# Patient Record
Sex: Male | Born: 1965 | Race: Black or African American | Hispanic: No | Marital: Single | Smoking: Never smoker
Health system: Southern US, Community
[De-identification: ages and names within clinical notes are randomized; demographics above are authoritative.]

## PROBLEM LIST (undated history)

## (undated) DIAGNOSIS — E119 Type 2 diabetes mellitus without complications: Secondary | ICD-10-CM

## (undated) DIAGNOSIS — I10 Essential (primary) hypertension: Principal | ICD-10-CM

## (undated) DIAGNOSIS — M1990 Unspecified osteoarthritis, unspecified site: Secondary | ICD-10-CM

## (undated) DIAGNOSIS — E669 Obesity, unspecified: Secondary | ICD-10-CM

## (undated) DIAGNOSIS — G5600 Carpal tunnel syndrome, unspecified upper limb: Secondary | ICD-10-CM

## (undated) DIAGNOSIS — E7800 Pure hypercholesterolemia, unspecified: Secondary | ICD-10-CM

## (undated) HISTORY — PX: KNEE ARTHROSCOPY: SHX127

## (undated) HISTORY — DX: Obesity, unspecified: E66.9

## (undated) HISTORY — PX: COLONOSCOPY: SHX174

## (undated) HISTORY — DX: Unspecified osteoarthritis, unspecified site: M19.90

## (undated) HISTORY — DX: Essential (primary) hypertension: I10

---

## 2013-09-28 ENCOUNTER — Encounter (HOSPITAL_BASED_OUTPATIENT_CLINIC_OR_DEPARTMENT_OTHER): Payer: Self-pay | Admitting: Emergency Medicine

## 2013-09-28 ENCOUNTER — Emergency Department (HOSPITAL_BASED_OUTPATIENT_CLINIC_OR_DEPARTMENT_OTHER): Payer: PRIVATE HEALTH INSURANCE

## 2013-09-28 ENCOUNTER — Emergency Department (HOSPITAL_BASED_OUTPATIENT_CLINIC_OR_DEPARTMENT_OTHER)
Admission: EM | Admit: 2013-09-28 | Discharge: 2013-09-29 | Disposition: A | Discharge status: Home / Self Care | Payer: PRIVATE HEALTH INSURANCE | Attending: Emergency Medicine | Admitting: Emergency Medicine

## 2013-09-28 DIAGNOSIS — R296 Repeated falls: Secondary | ICD-10-CM | POA: Diagnosis not present

## 2013-09-28 DIAGNOSIS — S59909A Unspecified injury of unspecified elbow, initial encounter: Secondary | ICD-10-CM | POA: Diagnosis present

## 2013-09-28 DIAGNOSIS — Y929 Unspecified place or not applicable: Secondary | ICD-10-CM | POA: Insufficient documentation

## 2013-09-28 DIAGNOSIS — M7022 Olecranon bursitis, left elbow: Secondary | ICD-10-CM

## 2013-09-28 DIAGNOSIS — Y939 Activity, unspecified: Secondary | ICD-10-CM | POA: Insufficient documentation

## 2013-09-28 DIAGNOSIS — S59919A Unspecified injury of unspecified forearm, initial encounter: Secondary | ICD-10-CM | POA: Diagnosis present

## 2013-09-28 DIAGNOSIS — S6990XA Unspecified injury of unspecified wrist, hand and finger(s), initial encounter: Secondary | ICD-10-CM

## 2013-09-28 DIAGNOSIS — M7020 Olecranon bursitis, unspecified elbow: Secondary | ICD-10-CM | POA: Insufficient documentation

## 2013-09-28 NOTE — ED Provider Notes (Signed)
CSN: [id number]     Arrival date & time 09/28/13  2024 History  This chart was scribed for Julianne Rice, MD by Jeanell Sparrow, ED Scribe. This patient was seen in room MH05/MH05 and the patient's care was started at 11:30 PM.   Chief Complaint  Patient presents with  . Arm Injury    Patient is a 48 y.o. male presenting with arm injury. The history is provided by the patient. No language interpreter was used.  Arm Injury Associated symptoms: no fever    HPI Comments: Alex Gray is a 48 y.o. male who presents to the Emergency Department complaining of a left elbow injury that occurred about 2 weeks ago. He states that he fell on his elbow. He reports that he recently had the affected area drained. He states that the drainage fluid was a clear. He reports no other symptoms. He's had recurrence of swelling to the elbow. He denies any fevers or chills. Denies any warmth or bony tenderness.  History reviewed. No pertinent past medical history. Past Surgical History  Procedure Laterality Date  . Knee arthroscopy     History reviewed. No pertinent family history. History  Substance Use Topics  . Smoking status: Never Smoker   . Smokeless tobacco: Not on file  . Alcohol Use: No    Review of Systems  Constitutional: Negative for fever and chills.  Musculoskeletal: Positive for joint swelling. Negative for arthralgias.  Skin: Negative for rash and wound.  Neurological: Negative for weakness and numbness.  All other systems reviewed and are negative.   Allergies  Review of patient's allergies indicates no known allergies.  Home Medications   Prior to Admission medications   Not on File   BP 153/88  Pulse 84  Temp(Src) 97.9 F (36.6 C) (Oral)  Resp 18  Ht 6\' 3"  (1.905 m)  Wt 260 lb (117.935 kg)  BMI 32.50 kg/m2  SpO2 98% Physical Exam  Nursing note and vitals reviewed. Constitutional: He is oriented to person, place, and time. He appears well-developed and well-nourished.  No distress.  HENT:  Head: Normocephalic and atraumatic.  Mouth/Throat: Oropharynx is clear and moist.  Eyes: Pupils are equal, round, and reactive to light.  Neck: Normal range of motion. Neck supple.  Cardiovascular: Normal rate.   Pulmonary/Chest: Effort normal.  Abdominal: Soft. Bowel sounds are normal.  Musculoskeletal: Normal range of motion. He exhibits tenderness. He exhibits no edema.  Visual full range of motion of the left elbow. Noted swelling over the olecranon process. His fluctuance. No overlying erythema or warmth. Patient has no bony tenderness. Distal pulses are 2+.  Neurological: He is alert and oriented to person, place, and time.  5/5 motor in all extremities. Sensation is intact.  Skin: Skin is warm and dry. No rash noted. No erythema.  Psychiatric: He has a normal mood and affect. His behavior is normal.    ED Course  Procedures (including critical care time) DIAGNOSTIC STUDIES: Oxygen Saturation is 98% on RA, normal by my interpretation.    COORDINATION OF CARE: 11:33 PM- Pt advised of plan for treatment and pt agrees.  Labs Review Labs Reviewed - No data to display  Imaging Review Dg Elbow Complete Left  09/28/2013   CLINICAL DATA:  Injury to the back of the elbow 1 1/2 weeks ago complaining of pain and swelling since that time.  EXAM: LEFT ELBOW - COMPLETE 3+ VIEW  COMPARISON:  No priors.  FINDINGS: Large amount of soft tissue swelling overlying the olecranon  process. Large enthesophyte off the dorsal aspect of the olecranon process. No acute displaced fracture, subluxation or dislocation.  IMPRESSION: 1. Large amount of soft tissue swelling overlying the olecranon process. 2. Negative for acute fracture.   Electronically Signed   By: Vinnie Langton M.D.   On: 09/28/2013 21:17     EKG Interpretation None      MDM   Final diagnoses:  None    I personally performed the services described in this documentation, which was scribed in my presence. The  recorded information has been reviewed and is accurate.  Exam consistent with inflammatory bursitis. Doubt infection. Patient asked to have it drained in the emergency department. Prior to completion procedure, patiet states he has to leave. He is advised to followup with hand surgery for persistent symptoms. Return precautions given.    Julianne Rice, MD 09/29/13 [phone]

## 2013-09-28 NOTE — ED Notes (Signed)
Pt c/o left elbow injury x 2 weeks ago

## 2013-09-29 MED ORDER — TRAMADOL HCL 50 MG PO TABS: 50.0000 mg | ORAL_TABLET | Freq: Four times a day (QID) | ORAL | Status: DC | PRN

## 2013-09-29 MED ORDER — IBUPROFEN 600 MG PO TABS: 600.0000 mg | ORAL_TABLET | Freq: Four times a day (QID) | ORAL | Status: DC | PRN

## 2013-09-29 NOTE — Discharge Instructions (Signed)
Bursitis °Bursitis is a swelling and soreness (inflammation) of a fluid-filled sac (bursa) that overlies and protects a joint. It can be caused by injury, overuse of the joint, arthritis or infection. The joints most likely to be affected are the elbows, shoulders, hips and knees. °HOME CARE INSTRUCTIONS  °· Apply ice to the affected area for 15-20 minutes each hour while awake for 2 days. Put the ice in a plastic bag and place a towel between the bag of ice and your skin. °· Rest the injured joint as much as possible, but continue to put the joint through a full range of motion, 4 times per day. (The shoulder joint especially becomes rapidly "frozen" if not used.) When the pain lessens, begin normal slow movements and usual activities. °· Only take over-the-counter or prescription medicines for pain, discomfort or fever as directed by your caregiver. °· Your caregiver may recommend draining the bursa and injecting medicine into the bursa. This may help the healing process. °· Follow all instructions for follow-up with your caregiver. This includes any orthopedic referrals, physical therapy and rehabilitation. Any delay in obtaining necessary care could result in a delay or failure of the bursitis to heal and chronic pain. °SEEK IMMEDIATE MEDICAL CARE IF:  °· Your pain increases even during treatment. °· You develop an oral temperature above 102° F (38.9° C) and have heat and inflammation over the involved bursa. °MAKE SURE YOU:  °· Understand these instructions. °· Will watch your condition. °· Will get help right away if you are not doing well or get worse. °Document Released: 02/03/2000 Document Revised: 04/30/2011 Document Reviewed: 04/27/2013 °Exit[employer]® Patient Information ©2015 [employer]. This information is not intended to replace advice given to you by your health care provider. Make sure you discuss any questions you have with your health care provider. ° °

## 2014-05-24 ENCOUNTER — Emergency Department (HOSPITAL_BASED_OUTPATIENT_CLINIC_OR_DEPARTMENT_OTHER)
Admission: EM | Admit: 2014-05-24 | Discharge: 2014-05-24 | Disposition: A | Discharge status: Home / Self Care | Payer: 59 | Attending: Emergency Medicine | Admitting: Emergency Medicine

## 2014-05-24 ENCOUNTER — Emergency Department (HOSPITAL_BASED_OUTPATIENT_CLINIC_OR_DEPARTMENT_OTHER): Payer: 59

## 2014-05-24 ENCOUNTER — Encounter (HOSPITAL_BASED_OUTPATIENT_CLINIC_OR_DEPARTMENT_OTHER): Payer: Self-pay

## 2014-05-24 DIAGNOSIS — Z8669 Personal history of other diseases of the nervous system and sense organs: Secondary | ICD-10-CM | POA: Insufficient documentation

## 2014-05-24 DIAGNOSIS — M25531 Pain in right wrist: Secondary | ICD-10-CM | POA: Diagnosis not present

## 2014-05-24 DIAGNOSIS — M25539 Pain in unspecified wrist: Secondary | ICD-10-CM

## 2014-05-24 HISTORY — DX: Carpal tunnel syndrome, unspecified upper limb: G56.00

## 2014-05-24 MED ORDER — HYDROCODONE-ACETAMINOPHEN 5-325 MG PO TABS: 1.0000 | ORAL_TABLET | Freq: Four times a day (QID) | ORAL | Status: DC | PRN

## 2014-05-24 MED ORDER — PREDNISONE 50 MG PO TABS: 50.0000 mg | ORAL_TABLET | Freq: Every day | ORAL | Status: DC

## 2014-05-24 MED ORDER — KETOROLAC TROMETHAMINE 60 MG/2ML IM SOLN
60.0000 mg | Freq: Once | INTRAMUSCULAR | Status: AC
  Administered 2014-05-24: 60 mg via INTRAMUSCULAR
  Filled 2014-05-24: qty 2

## 2014-05-24 NOTE — ED Notes (Signed)
Patient transported to X-ray 

## 2014-05-24 NOTE — Discharge Instructions (Signed)
Return here as needed.  Follow-up with the orthopedist provided.  Ice and elevate the wrist

## 2014-05-24 NOTE — ED Notes (Signed)
right wrist pain x 3 days-denies injury-hx of carpal tunnel

## 2014-05-24 NOTE — ED Provider Notes (Signed)
CSN: [id number]     Arrival date & time 05/24/14  1427 History   First MD Initiated Contact with Patient 05/24/14 1611     Chief Complaint  Patient presents with  . Wrist Pain     (Consider location/radiation/quality/duration/timing/severity/associated sxs/prior Treatment) HPI Patient presents to the emergency department with right wrist pain it has been ongoing for the last 3 days.  The patient states that he has had issues with his wrists in the past.  Patient states that he does a lot of typing and he feels that that causes problems in his wrist.  The patient states that he bought a brace to wear for the pain, which did not seem to help significantly.  The patient took some ibuprofen with some relief of his symptoms.  Patient denies fevers, weakness, numbness, arm swelling, shortness of breath or neck pain.  The patient states that movement and palpation makes the pain worse Past Medical History  Diagnosis Date  . Carpal tunnel syndrome    Past Surgical History  Procedure Laterality Date  . Knee arthroscopy     No family history on file. History  Substance Use Topics  . Smoking status: Never Smoker   . Smokeless tobacco: Not on file  . Alcohol Use: No    Review of Systems  All other systems negative except as documented in the HPI. All pertinent positives and negatives as reviewed in the HPI.  Allergies  Review of patient's allergies indicates no known allergies.  Home Medications   Prior to Admission medications   Not on File   BP 147/94 mmHg  Pulse 92  Temp(Src) 98.3 F (36.8 C) (Oral)  Resp 18  Ht 6\' 3"  (1.905 m)  Wt 260 lb (117.935 kg)  BMI 32.50 kg/m2  SpO2 96% Physical Exam  Constitutional: He is oriented to person, place, and time. He appears well-developed and well-nourished.  HENT:  Head: Normocephalic and atraumatic.  Pulmonary/Chest: Effort normal.  Musculoskeletal:       Right wrist: He exhibits decreased range of motion, tenderness and swelling.  He exhibits no bony tenderness, no crepitus, no deformity and no laceration.  Neurological: He is alert and oriented to person, place, and time. He exhibits normal muscle tone. Coordination normal.  Skin: Skin is warm and dry. No rash noted.  Nursing note and vitals reviewed.   ED Course  Procedures (including critical care time) Labs Review Labs Reviewed - No data to display  Imaging Review Dg Wrist 2 Views Right  05/24/2014   CLINICAL DATA:  Patient with right wrist pain for multiple days. No definite traumatic injury.  EXAM: RIGHT WRIST - 2 VIEW  COMPARISON:  None.  FINDINGS: AP and lateral views of the wrist demonstrate no evidence for acute fracture. Joint space narrowing at the radiocarpal joint with associated subchondral sclerosis involving the distal aspect of the radius. The soft tissue abnormality.  IMPRESSION: No acute osseous abnormality.  Radiocarpal joint degenerative change.   Electronically Signed   By: Lovey Newcomer M.D.   On: 05/24/2014 16:33   Patient be referred to orthopedics for further evaluation.  Told to return here as needed.  Ice and elevate the wrist.  I explained to the patient that this could be carpal tunnel versus acute gout flare.    Dalia Heading, PA-C 05/24/14 1726  Malvin Johns, MD 05/25/14 [phone]

## 2015-07-13 DIAGNOSIS — S99911A Unspecified injury of right ankle, initial encounter: Secondary | ICD-10-CM | POA: Diagnosis not present

## 2015-07-13 DIAGNOSIS — M19071 Primary osteoarthritis, right ankle and foot: Secondary | ICD-10-CM | POA: Diagnosis not present

## 2015-07-13 DIAGNOSIS — M25571 Pain in right ankle and joints of right foot: Secondary | ICD-10-CM | POA: Diagnosis not present

## 2015-07-26 DIAGNOSIS — M109 Gout, unspecified: Secondary | ICD-10-CM | POA: Diagnosis not present

## 2015-07-26 DIAGNOSIS — M79672 Pain in left foot: Secondary | ICD-10-CM | POA: Diagnosis not present

## 2015-11-24 ENCOUNTER — Encounter: Payer: Self-pay | Admitting: *Deleted

## 2015-11-24 ENCOUNTER — Ambulatory Visit
Admission: EM | Admit: 2015-11-24 | Discharge: 2015-11-24 | Payer: BLUE CROSS/BLUE SHIELD | Attending: Family Medicine | Admitting: Family Medicine

## 2015-11-24 ENCOUNTER — Encounter (HOSPITAL_BASED_OUTPATIENT_CLINIC_OR_DEPARTMENT_OTHER): Payer: Self-pay | Admitting: *Deleted

## 2015-11-24 ENCOUNTER — Emergency Department (HOSPITAL_BASED_OUTPATIENT_CLINIC_OR_DEPARTMENT_OTHER)
Admission: EM | Admit: 2015-11-24 | Discharge: 2015-11-24 | Disposition: A | Discharge status: Home / Self Care | Payer: BLUE CROSS/BLUE SHIELD | Attending: Emergency Medicine | Admitting: Emergency Medicine

## 2015-11-24 DIAGNOSIS — G5600 Carpal tunnel syndrome, unspecified upper limb: Secondary | ICD-10-CM | POA: Insufficient documentation

## 2015-11-24 DIAGNOSIS — R030 Elevated blood-pressure reading, without diagnosis of hypertension: Secondary | ICD-10-CM | POA: Diagnosis not present

## 2015-11-24 DIAGNOSIS — Z79899 Other long term (current) drug therapy: Secondary | ICD-10-CM | POA: Diagnosis not present

## 2015-11-24 DIAGNOSIS — R42 Dizziness and giddiness: Secondary | ICD-10-CM | POA: Diagnosis present

## 2015-11-24 DIAGNOSIS — I10 Essential (primary) hypertension: Secondary | ICD-10-CM | POA: Diagnosis not present

## 2015-11-24 DIAGNOSIS — R55 Syncope and collapse: Secondary | ICD-10-CM | POA: Insufficient documentation

## 2015-11-24 DIAGNOSIS — Z9889 Other specified postprocedural states: Secondary | ICD-10-CM | POA: Diagnosis not present

## 2015-11-24 NOTE — ED Triage Notes (Signed)
Pt felt "light headed" at work today and Scientist, clinical (histocompatibility and immunogenetics) checked BP and it was high. Pt states diaphoretic at onset. Asymptomatic at present.

## 2015-11-24 NOTE — ED Provider Notes (Signed)
[place] [facility] Provider Note   CSN: [id number] Arrival date & time: 11/24/15  2017  By signing my name below, I, Emmanuella Mensah, attest that this documentation has been prepared under the direction and in the presence of Deno Etienne, DO. Electronically Signed: Judithann Sauger, ED Scribe. 11/24/15. 9:05 PM.   History   Chief Complaint Chief Complaint  Patient presents with  . Dizziness    HPI Comments: Alex Gray is a 50 y.o. male who presents to the Emergency Department for evaluation s/p one episode of lightheadedness with increase in BP and diaphoresis while at work at 2 pm today. He explains that he was walking out of [employer] on break during onset of his symptoms with a sensation of fatigue where he felt "drained" but his symptoms progressed when he returned to work. He states that his symptoms resolved after he laid down for a few minutes but called EMS for evaluation. He adds that although EMS told him that he had a normal EKG, he wanted to follow up with an UC. At the [facility], he was advised to be evaluated in an ED after another normal EKG there. No alleviating factors noted. Pt has not tried any medications PTA. He is currently asymptomatic. He denies any current falls, injuries, or trauma but explains that lately he has not been having adequate sleep due to ongoing family issues. He also denies any recent travel outside the country or hx of cancers. Pt has NKDA. He denies any chest pain, shortness of breath, palpitations, nausea, vomiting, headache, dizziness, or weakness.   No PCP.   The history is provided by the patient. No language interpreter was used.     Past Medical History:  Diagnosis Date  . Carpal tunnel syndrome     There are no active problems to display for this patient.   Past Surgical History:  Procedure Laterality Date  . KNEE ARTHROSCOPY         Home Medications    Prior to Admission medications   Medication Sig Start Date End Date  Taking? Authorizing Provider  HYDROcodone-acetaminophen (NORCO/VICODIN) 5-325 MG per tablet Take 1 tablet by mouth every 6 (six) hours as needed for moderate pain. 05/24/14   Dalia Heading, PA-C  naproxen sodium (ANAPROX) 220 MG tablet Take 220 mg by mouth 2 (two) times daily with a meal.    Historical Provider, MD  predniSONE (DELTASONE) 50 MG tablet Take 1 tablet (50 mg total) by mouth daily. 05/24/14   Dalia Heading, PA-C    Family History No family history on file.  Social History Social History  Substance Use Topics  . Smoking status: Never Smoker  . Smokeless tobacco: Never Used  . Alcohol use No     Allergies   Review of patient's allergies indicates no known allergies.   Review of Systems Review of Systems  Constitutional: Negative for chills and fever.  HENT: Negative for congestion and facial swelling.   Eyes: Negative for discharge and visual disturbance.  Gastrointestinal: Negative for abdominal pain.  Musculoskeletal: Negative for arthralgias and myalgias.  Skin: Negative for color change and rash.  Neurological: Negative for tremors and syncope.  Psychiatric/Behavioral: Negative for confusion and dysphoric mood.     Physical Exam Updated Vital Signs BP 159/91   Pulse 67   Temp 98.5 F (36.9 C) (Oral)   Resp 20   Ht 6\' 3"  (1.905 m)   Wt 270 lb (122.5 kg)   SpO2 97%   BMI 33.75 kg/m  Physical Exam  Constitutional: He is oriented to person, place, and time. He appears well-developed and well-nourished.  HENT:  Head: Normocephalic and atraumatic.  Eyes: EOM are normal. Pupils are equal, round, and reactive to light.  Neck: Normal range of motion. Neck supple. No JVD present.  Cardiovascular: Normal rate and regular rhythm.  Exam reveals no gallop and no friction rub.   No murmur heard. Pulmonary/Chest: No respiratory distress. He has no wheezes.  Abdominal: He exhibits no distension. There is no rebound and no guarding.  Musculoskeletal:  Normal range of motion.  Neurological: He is alert and oriented to person, place, and time.  Skin: No rash noted. No pallor.  Psychiatric: He has a normal mood and affect. His behavior is normal.  Nursing note and vitals reviewed.    ED Treatments / Results  DIAGNOSTIC STUDIES: Oxygen Saturation is 97% on RA, normal by my interpretation.    COORDINATION OF CARE: 9:04 PM- Pt advised of plan for treatment and pt agrees.   Labs (all labs ordered are listed, but only abnormal results are displayed) Labs Reviewed - No data to display  EKG  EKG Interpretation None       Radiology No results found.  Procedures Procedures (including critical care time)  Medications Ordered in ED Medications - No data to display   Initial Impression / Assessment and Plan / ED Course  Deno Etienne, DO has reviewed the triage vital signs and the nursing notes.  Pertinent labs & imaging results that were available during my care of the patient were reviewed by me and considered in my medical decision making (see chart for details).  Clinical Course    50 yo M The chief complaints of a near syncopal episode. This happened earlier today. Since then the patient has had no complaints. He denies anything leading up to this. Denies any vomiting diarrhea denies any blood in his stool or dark stools. Patient denies fevers or chills denies cough or congestion. Patient was seen by EMS as well as an urgent care. He said the EKGs there were fine. He declines having a repeat study done here. I discussed that the patient can have this followed up outpatient name able to put on a Holter monitor if he has recurrent symptoms. Discharge home.  Final Clinical Impressions(s) / ED Diagnoses   Final diagnoses:  Near syncope    New Prescriptions Discharge Medication List as of 11/24/2015  9:06 PM     I personally performed the services described in this documentation, which was scribed in my presence. The recorded  information has been reviewed and is accurate.      Deno Etienne, DO 11/24/15 2221

## 2015-11-24 NOTE — ED Provider Notes (Signed)
[facility]    CSN: [id number] Arrival date & time: 11/24/15  1433     History   Chief Complaint Chief Complaint  Patient presents with  . Hypertension  . Near Syncope    HPI Alex Gray is a 50 y.o. male.   Patient is a large African-American male who comes in today with episode at work being lightheaded and diaphoretic. Apparently at work after lunch he became lightheaded and sweaty when the plant nurse EMS evaluated him his blood pressure is markedly elevated. EMS want to take him to the emergency room she declined he states but the left he felt better as blood pressures start to come down he does not have a history hypertension he denies having any chest pain during the spell but the spell lasted about 20 minutes he states that the EMS told his EKG was fine. Despite that he still came here to be reassured that everything was okay  There is a history of carpal tunnel syndrome knee surgery and is a question of gout with this patient. He does not smoke. States is a history of hypertension diabetes in the family. He reports that he's not been able to sleep lately and thinks the problem with sleep deprivation.   The history is provided by the patient. No language interpreter was used.  Hypertension  This is a new problem. The current episode started 1 to 2 hours ago. The problem has been gradually improving. Pertinent negatives include no chest pain, no abdominal pain, no headaches and no shortness of breath. Nothing aggravates the symptoms. Nothing relieves the symptoms. He has tried nothing for the symptoms. The treatment provided no relief.  Near Syncope  Pertinent negatives include no chest pain, no abdominal pain, no headaches and no shortness of breath.    Past Medical History:  Diagnosis Date  . Carpal tunnel syndrome     There are no active problems to display for this patient.   Past Surgical History:  Procedure Laterality Date  . KNEE ARTHROSCOPY          Home Medications    Prior to Admission medications   Medication Sig Start Date End Date Taking? Authorizing Provider  naproxen sodium (ANAPROX) 220 MG tablet Take 220 mg by mouth 2 (two) times daily with a meal.   Yes Historical Provider, MD  HYDROcodone-acetaminophen (NORCO/VICODIN) 5-325 MG per tablet Take 1 tablet by mouth every 6 (six) hours as needed for moderate pain. 05/24/14   Dalia Heading, PA-C  predniSONE (DELTASONE) 50 MG tablet Take 1 tablet (50 mg total) by mouth daily. 05/24/14   Dalia Heading, PA-C    Family History History reviewed. No pertinent family history.  Social History Social History  Substance Use Topics  . Smoking status: Never Smoker  . Smokeless tobacco: Never Used  . Alcohol use No     Allergies   Review of patient's allergies indicates no known allergies.   Review of Systems Review of Systems  Constitutional: Positive for diaphoresis.  Respiratory: Negative for shortness of breath.   Cardiovascular: Positive for near-syncope. Negative for chest pain.  Gastrointestinal: Negative for abdominal pain.  Neurological: Positive for weakness and light-headedness. Negative for headaches.  All other systems reviewed and are negative.    Physical Exam Triage Vital Signs ED Triage Vitals  Enc Vitals Group     BP 11/24/15 1459 (!) 160/99     Pulse Rate 11/24/15 1459 80     Resp 11/24/15 1459 16  Temp 11/24/15 1459 98.1 F (36.7 C)     Temp Source 11/24/15 1459 Oral     SpO2 11/24/15 1459 98 %     Weight 11/24/15 1501 270 lb (122.5 kg)     Height 11/24/15 1501 6\' 3"  (1.905 m)     Head Circumference --      Peak Flow --      Pain Score --      Pain Loc --      Pain Edu? --      Excl. in [place]? --    No data found.   Updated Vital Signs BP (!) 160/99 (BP Location: Left Arm)   Pulse 80   Temp 98.1 F (36.7 C) (Oral)   Resp 16   Ht 6\' 3"  (1.905 m)   Wt 270 lb (122.5 kg)   SpO2 98%   BMI 33.75 kg/m   Visual  Acuity Right Eye Distance:   Left Eye Distance:   Bilateral Distance:    Right Eye Near:   Left Eye Near:    Bilateral Near:     Physical Exam  Constitutional: He is oriented to person, place, and time. He appears well-developed and well-nourished.  Non-toxic appearance. He does not have a sickly appearance.  Obese male  HENT:  Head: Normocephalic and atraumatic.  Eyes: EOM are normal. Pupils are equal, round, and reactive to light.  Neck: Normal range of motion. Neck supple.  Cardiovascular: Normal rate and regular rhythm.   Pulmonary/Chest: Effort normal.  Musculoskeletal: Normal range of motion.  Neurological: He is alert and oriented to person, place, and time.  Skin: Skin is warm.  Psychiatric: He has a normal mood and affect.  Vitals reviewed.    UC Treatments / Results  Labs (all labs ordered are listed, but only abnormal results are displayed) Labs Reviewed - No data to display  EKG  EKG Interpretation None       ED ECG REPORT I, Wateen Varon H, the attending physician, personally viewed and interpreted this ECG.   Date: 11/24/2015  EKG Time: 15:15:53 Rate: 76 Rhythm: there are no previous tracings available for comparison, sinus rhythm with some PACs present  Axis: 33  Intervals:none  ST&T Change: Q wave in 3 and aVF possible old inferior infarct unable to say for sure  Radiology No results found.  Procedures Procedures (including critical care time)  Medications Ordered in UC Medications - No data to display   Initial Impression / Assessment and Plan / UC Course  I have reviewed the triage vital signs and the nursing notes.  Pertinent labs & imaging results that were available during my care of the patient were reviewed by me and considered in my medical decision making (see chart for details).  Clinical Course    Patient is pretty sure that his problems been occurring because his lack of sleep because his stress and tension the family  explained to him that usually lack of sleep doesn't cause a near syncopal episode. I have explained to him that this is an urgent care and that the EMS was going to transport him to [facility] ED for further evaluation. I recommend serial cardiac enzymes and monitoring and possible CT of the head for this near syncopal episode. He feels that since his blood pressure has come down not normal still everything is okay. I reiterated that he should be in the ER to be evaluated with serial cardiac enzymes minimal I've also explained to him I cannot give  him reassurance of what happened to him in an urgent care. He requests a note for work for today and states he will go to [facility]. Note was written for today and tomorrow. He's refused EMS and wants to drive there since he lives in [place].  Final Clinical Impressions(s) / UC Diagnoses   Final diagnoses:  None    New Prescriptions New Prescriptions   No medications on file     Frederich Cha, MD 11/24/15 1554

## 2015-11-24 NOTE — ED Triage Notes (Signed)
States at lunch he had an episode of dizziness and HTN. EMS was called to the scene and his BS and EKG were normal. His symptoms did not return but he went to UC right after that and had the same negative findings. He remains asymptomatic tonight but was told by UC and ED could do a more extensive test.

## 2015-11-26 ENCOUNTER — Telehealth: Payer: Self-pay

## 2015-11-26 NOTE — Telephone Encounter (Signed)
No answer and unable to leave message no voicemail

## 2015-12-01 ENCOUNTER — Encounter (HOSPITAL_BASED_OUTPATIENT_CLINIC_OR_DEPARTMENT_OTHER): Payer: Self-pay

## 2015-12-01 ENCOUNTER — Emergency Department (HOSPITAL_BASED_OUTPATIENT_CLINIC_OR_DEPARTMENT_OTHER)
Admission: EM | Admit: 2015-12-01 | Discharge: 2015-12-01 | Disposition: A | Discharge status: Home / Self Care | Payer: BLUE CROSS/BLUE SHIELD | Attending: Emergency Medicine | Admitting: Emergency Medicine

## 2015-12-01 ENCOUNTER — Emergency Department (HOSPITAL_BASED_OUTPATIENT_CLINIC_OR_DEPARTMENT_OTHER): Payer: BLUE CROSS/BLUE SHIELD

## 2015-12-01 DIAGNOSIS — I10 Essential (primary) hypertension: Secondary | ICD-10-CM | POA: Insufficient documentation

## 2015-12-01 DIAGNOSIS — R0789 Other chest pain: Secondary | ICD-10-CM | POA: Diagnosis not present

## 2015-12-01 DIAGNOSIS — H539 Unspecified visual disturbance: Secondary | ICD-10-CM | POA: Diagnosis not present

## 2015-12-01 DIAGNOSIS — R42 Dizziness and giddiness: Secondary | ICD-10-CM | POA: Insufficient documentation

## 2015-12-01 DIAGNOSIS — R61 Generalized hyperhidrosis: Secondary | ICD-10-CM | POA: Insufficient documentation

## 2015-12-01 DIAGNOSIS — R55 Syncope and collapse: Secondary | ICD-10-CM | POA: Diagnosis not present

## 2015-12-01 DIAGNOSIS — R002 Palpitations: Secondary | ICD-10-CM | POA: Diagnosis not present

## 2015-12-01 LAB — BASIC METABOLIC PANEL
Anion gap: 7 (ref 5–15)
BUN: 14 mg/dL (ref 6–20)
CALCIUM: 9.3 mg/dL (ref 8.9–10.3)
CHLORIDE: 105 mmol/L (ref 101–111)
CO2: 25 mmol/L (ref 22–32)
CREATININE: 1.05 mg/dL (ref 0.61–1.24)
GFR calc non Af Amer: >60 mL/min (ref >60)
Glucose, Bld: 90 mg/dL (ref 65–99)
Potassium: 4.5 mmol/L (ref 3.5–5.1)
SODIUM: 137 mmol/L (ref 135–145)

## 2015-12-01 LAB — CBC WITH DIFFERENTIAL/PLATELET
BASOS ABS: 0 10*3/uL (ref 0.0–0.1)
Basophils Relative: 0 %
EOS ABS: 0.1 10*3/uL (ref 0.0–0.7)
EOS PCT: 3 %
HCT: 46.5 % (ref 39.0–52.0)
HEMOGLOBIN: 15.4 g/dL (ref 13.0–17.0)
LYMPHS ABS: 1.7 10*3/uL (ref 0.7–4.0)
Lymphocytes Relative: 34 %
MCH: 26 pg (ref 26.0–34.0)
MCHC: 33.1 g/dL (ref 30.0–36.0)
MCV: 78.4 fL (ref 78.0–100.0)
Monocytes Absolute: 0.4 10*3/uL (ref 0.1–1.0)
Monocytes Relative: 8 %
NEUTROS PCT: 55 %
Neutro Abs: 2.8 10*3/uL (ref 1.7–7.7)
PLATELETS: 225 10*3/uL (ref 150–400)
RBC: 5.93 MIL/uL — AB (ref 4.22–5.81)
RDW: 15.3 % (ref 11.5–15.5)
WBC: 5.1 10*3/uL (ref 4.0–10.5)

## 2015-12-01 LAB — TROPONIN I: Troponin I: <0.03 ng/mL (ref <0.03)

## 2015-12-01 MED ORDER — PANTOPRAZOLE SODIUM 20 MG PO TBEC: 20.0000 mg | DELAYED_RELEASE_TABLET | Freq: Every day | ORAL | 0 refills | Status: DC

## 2015-12-01 NOTE — ED Triage Notes (Signed)
C/o cont'd elevated BP-was seen here 10/5 for same per pt-NAD-steady gait

## 2015-12-01 NOTE — ED Notes (Signed)
MD at bedside. 

## 2015-12-01 NOTE — ED Provider Notes (Signed)
[facility] Provider Note   CSN: [id number] Arrival date & time: 12/01/15  1642  By signing my name below, I, Dolores Hoose, attest that this documentation has been prepared under the direction and in the presence of non-physician practitioner, Delsa Grana, PA-C. Electronically Signed: Dolores Hoose, Scribe. 12/01/2015. 5:08 PM.  History   Chief Complaint Chief Complaint  Patient presents with  . Hypertension    The history is provided by the patient. No language interpreter was used.   HPI Comments:  Alex Gray is an otherwise healthy 50 y.o. male who presents to the Emergency Department complaining of "hypertension" and complaining of intermittent episodes of light-headedness occurring over the past week, episodes occur while at work and often while looking at his computer screen. Pt was seen a week ago in the ED for same after an episode of light-headedness and diaphoresis occurring at work. Pt reports that he thinks these symptoms occur due to his blood pressure going up, which he states he monitors at home, and that this is not effected by any exertion. He has intermittent blurry vision not always associated with episodes, and diaphoresis occurred only once last week during an episode which was evaluated by EMS, urgent care and the ER. Pt denies associated tinnitus, vertigo, HA, syncope, leg swelling, weight gain, coughing, wheezing, nausea, vomiting, or SOB.  He has occasional chest tightness in his left lower anterior ribs, but notes that this has been happening for a while and does not believe this is related to his current complaint.  The tightness is also described as a "twinge" and is reproducible with movement.  He has occasional palpitations.  His main concern is risk for heart attack.  He has a family medicine doctor appointment in 4 days for a complete physical and initial evaluation.  No prior PCP for several years.  He is concerned with high blood pressure.  He has  family history of cardiac disease.  He denies hx of DM.  Past Medical History:  Diagnosis Date  . Carpal tunnel syndrome     There are no active problems to display for this patient.   Past Surgical History:  Procedure Laterality Date  . KNEE ARTHROSCOPY      Home Medications    Prior to Admission medications   Medication Sig Start Date End Date Taking? Authorizing Provider  pantoprazole (PROTONIX) 20 MG tablet Take 1 tablet (20 mg total) by mouth daily. 12/01/15   Delsa Grana, PA-C    Family History No family history on file.  Social History Social History  Substance Use Topics  . Smoking status: Never Smoker  . Smokeless tobacco: Never Used  . Alcohol use No     Allergies   Review of patient's allergies indicates no known allergies.   Review of Systems Review of Systems  Constitutional: Positive for diaphoresis. Negative for unexpected weight change.  HENT: Negative for tinnitus.   Eyes: Positive for visual disturbance.  Respiratory: Negative for cough, shortness of breath and wheezing.   Cardiovascular: Positive for palpitations. Negative for leg swelling.  Gastrointestinal: Negative for vomiting.  All other systems reviewed and are negative.    Physical Exam Updated Vital Signs BP 149/96 (BP Location: Left Arm)   Pulse 78   Temp 98.1 F (36.7 C) (Oral)   Resp 20   Ht 6\' 3"  (1.905 m)   Wt 122.5 kg   SpO2 98%   BMI 33.75 kg/m   Physical Exam  Constitutional: He is oriented to person,  place, and time. He appears well-developed and well-nourished. No distress.  HENT:  Head: Normocephalic and atraumatic.  Nose: Nose normal.  Mouth/Throat: Oropharynx is clear and moist. No oropharyngeal exudate.  Eyes: Conjunctivae and EOM are normal. Pupils are equal, round, and reactive to light. Right eye exhibits no discharge. Left eye exhibits no discharge. No scleral icterus.  Neck: Normal range of motion. Neck supple. No JVD present. No tracheal deviation  present.  Cardiovascular: Normal rate, regular rhythm, normal heart sounds and intact distal pulses.  Exam reveals no gallop and no friction rub.   No murmur heard. Symmetrical radial pulses and posterior tibialis pulses 2+. No LE edema.   Pulmonary/Chest: Effort normal and breath sounds normal. No stridor. No respiratory distress. He has no wheezes. He has no rales. He exhibits no tenderness.  Abdominal: Soft. Bowel sounds are normal. He exhibits no distension and no mass. There is no tenderness. There is no rebound and no guarding.  Musculoskeletal: Normal range of motion. He exhibits no deformity.  Neurological: He is alert and oriented to person, place, and time. He exhibits normal muscle tone. Coordination normal.  Skin: Skin is warm and dry. Capillary refill takes less than 2 seconds. No rash noted. He is not diaphoretic. No erythema. No pallor.  Psychiatric: He has a normal mood and affect. His behavior is normal. Judgment and thought content normal.  Nursing note and vitals reviewed.   ED Treatments / Results  DIAGNOSTIC STUDIES:  Oxygen Saturation is 98% on RA, normal by my interpretation.    COORDINATION OF CARE:  5:30 PM Discussed treatment plan with pt at bedside which includes blood work, CXR referral to cardiology and pt agreed to plan.  Labs (all labs ordered are listed, but only abnormal results are displayed) Labs Reviewed  CBC WITH DIFFERENTIAL/PLATELET - Abnormal; Notable for the following:       Result Value   RBC 5.93 (*)    All other components within normal limits  BASIC METABOLIC PANEL  TROPONIN I    EKG  EKG Interpretation None       Radiology Dg Chest 2 View  Result Date: 12/01/2015 CLINICAL DATA:  Hypertension.  Weakness.  Near syncope. EXAM: CHEST  2 VIEW COMPARISON:  None. FINDINGS: Heart size is normal. Mediastinal shadows are normal. Pulmonary vascularity is normal. There are minor linear scars or atelectatic areas in the lower lungs. No  effusions. No bony abnormality. IMPRESSION: Normal except for minor linear densities in the lower lungs that could represent small scars or minimal atelectasis. Electronically Signed   By: Nelson Chimes M.D.   On: 12/01/2015 18:47    Procedures Procedures (including critical care time)  Medications Ordered in ED Medications - No data to display   Initial Impression / Assessment and Plan / ED Course  I have reviewed the triage vital signs and the nursing notes.  Pertinent labs & imaging results that were available during my care of the patient were reviewed by me and considered in my medical decision making (see chart for details).  Clinical Course   Pt with intermittent brief episodes of lightheadedness x 1 week, not associated with CP, SOB, N, V.  Is not exertional in nature.  He is very concerned with his BP and MI.  History is not suspicious for ACS.  Pt has been seen several times in the past week and does have an upcoming PCP appointment in 4 days.  Will screen for CP/near syncope with CXR, EKG, basic labs and  troponin.  Pt BP is mildly elevated, I reviewed his chart and BP today is lower than documented vitals over the past 3-4 years.  Pt will likely need antihypertensives, but believe this will be better for the pt if his new PCP evals and prescribes.  No concern for end organ damage.  Basic labs unremarkable, negative troponin I EKG normal sinus rhythm with occasional PACs, CXR negative for acute pulmonary pathology, minor linear density in lowerlungs scars vs minimal atelectasis  Patient discharged home in good condition with stable vital signs.  Will follow up with PCP in 4 days.  Anticipating initiation of antihypertensives  Final Clinical Impressions(s) / ED Diagnoses   Final diagnoses:  Lightheadedness  Atypical chest pain    New Prescriptions Discharge Medication List as of 12/01/2015  7:27 PM    START taking these medications   Details  pantoprazole (PROTONIX) 20  MG tablet Take 1 tablet (20 mg total) by mouth daily., Starting Thu 12/01/2015, Print        I personally performed the services described in this documentation, which was scribed in my presence. The recorded information has been reviewed and is accurate.      Delsa Grana, PA-C 12/01/15 2011    Gwenyth Allegra Tegeler, MD 12/02/15 [phone]

## 2015-12-03 ENCOUNTER — Encounter: Payer: Self-pay | Admitting: Family Medicine

## 2015-12-05 ENCOUNTER — Encounter: Payer: Self-pay | Admitting: Family Medicine

## 2015-12-05 ENCOUNTER — Ambulatory Visit (INDEPENDENT_AMBULATORY_CARE_PROVIDER_SITE_OTHER): Payer: BLUE CROSS/BLUE SHIELD | Admitting: Family Medicine

## 2015-12-05 VITALS — BP 140/92 | HR 80 | Temp 98.9°F | Ht 74.0 in | Wt 285.6 lb

## 2015-12-05 DIAGNOSIS — Z114 Encounter for screening for human immunodeficiency virus [HIV]: Secondary | ICD-10-CM | POA: Diagnosis not present

## 2015-12-05 DIAGNOSIS — Z2821 Immunization not carried out because of patient refusal: Secondary | ICD-10-CM

## 2015-12-05 DIAGNOSIS — E669 Obesity, unspecified: Secondary | ICD-10-CM | POA: Diagnosis not present

## 2015-12-05 DIAGNOSIS — Z0000 Encounter for general adult medical examination without abnormal findings: Secondary | ICD-10-CM | POA: Diagnosis not present

## 2015-12-05 DIAGNOSIS — I10 Essential (primary) hypertension: Secondary | ICD-10-CM | POA: Diagnosis not present

## 2015-12-05 NOTE — Progress Notes (Signed)
Chief Complaint  Patient presents with  . Establish Care    Pt reports he needs a physical/ Pt states that he was at work one day and his BP spiked and he went to be seen for it but noted that it has not happened before     Well Male Alex Gray is here for a complete physical.   His last physical was >1 year(s) ago.  Current diet: in general, a "healthy" diet; cleaning up diet   Current exercise: walking Weight trend: stable Does pt snore? No. Daytime fatigue? No. Seat belt? Yes. Concerns: BP- was told he had a BP [id number] systolic at work one day. EMS was called and after he lay down, he started to feel better and his BP normalized. No hx of HTN, takes no medications for this. He has been at the ED several times over the past couple weeks and had 3 EKGs done, all of which are normal. He is starting to clean up his diet and eat more baked foods and less fried foods. He is drinking more water. He walks at work and at home. Does not lift weights. Interested in biking. He does not routinely check his BP at home. Denies vision changes, chest pain or SOB.  Past Medical History:  Diagnosis Date  . Carpal tunnel syndrome     Past Surgical History:  Procedure Laterality Date  . KNEE ARTHROSCOPY     Medications  Current Outpatient Prescriptions on File Prior to Visit  Medication Sig Dispense Refill  . pantoprazole (PROTONIX) 20 MG tablet Take 1 tablet (20 mg total) by mouth daily. 14 tablet 0   Allergies No Known Allergies Family History Family History  Problem Relation Age of Onset  . Diabetes Mother   . Hypertension Father   . Diabetes Father      Social History   Social History  . Marital status: Single   Social History Main Topics  . Smoking status: Never Smoker  . Smokeless tobacco: Never Used  . Alcohol use No  . Drug use: No   Review of Systems: Constitutional:  no unexpected change in weight, no fevers or chills Eye:  no recent significant change in  vision Ear/Nose/Mouth/Throat:  Ears:  no tinnitus or hearing loss Nose/Mouth/Throat:  no complaints of nasal congestion or bleeding, no sore throat and oral sores Cardiovascular:  no chest pain, no palpitations Respiratory:  no cough and no shortness of breath Gastrointestinal:  no abdominal pain, no change in bowel habits, no nausea, vomiting, diarrhea, or constipation and no black or bloody stool GU:  Male: negative for dysuria, frequency, and incontinence and negative for prostate symptoms Musculoskeletal/Extremities:  no pain, redness, or swelling of the joints Integumentary (Skin/Breast):  no abnormal skin lesions reported Neurologic:  no headaches, no numbness, tingling Endocrine:  weight changes, masses in the neck, heat/cold intolerance, bowel or skin changes, or cardiovascular system symptoms Hematologic/Lymphatic:  no abnormal bleeding, no HIV risk factors, no night sweats, no swollen nodes, no weight loss  Exam BP (!) 140/92 (BP Location: Left Arm)   Pulse 80   Temp 98.9 F (37.2 C)   Ht 6\' 2"  (1.88 m)   Wt 285 lb 9.6 oz (129.5 kg)   SpO2 98%   BMI 36.67 kg/m  General:  well developed, well nourished, in no apparent distress Skin:  no significant moles, warts, or growths Head:  no masses, lesions, or tenderness Eyes:  pupils equal and round, sclera anicteric without injection; there is a  slightly raised and yellowish appearing lesion on the lateral L sclera, no erythema Ears:  canals without lesions, TMs shiny without retraction, no obvious effusion, no erythema Nose:  nares patent, septum midline, mucosa normal Throat/Pharynx:  lips and gingiva without lesion; tongue and uvula midline; non-inflamed pharynx; no exudates or postnasal drainage Neck: neck supple without adenopathy, thyromegaly, or masses Lungs:  clear to auscultation, breath sounds equal bilaterally, no respiratory distress Cardio:  regular rate and rhythm without murmurs, heart sounds without clicks or  rubs Abdomen:  abdomen soft, nontender; bowel sounds normal; no masses or organomegaly Genital (male): circumcised penis, no lesions or discharge; testes present bilaterally without masses or tenderness Rectal: Deferred Musculoskeletal:  symmetrical muscle groups noted without atrophy or deformity Extremities:  no clubbing, cyanosis, or edema, no deformities, no skin discoloration Neuro:  gait normal; deep tendon reflexes normal and symmetric Psych: well oriented with normal range of affect and appropriate judgment/insight  Assessment and Plan  Well adult exam  Essential hypertension, benign - Plan: Microalbumin / creatinine urine ratio  Encounter for screening for HIV - Plan: HIV antibody  Class 1 obesity without serious comorbidity in adult, unspecified BMI, unspecified obesity type - Plan: Comprehensive metabolic panel, Lipid panel  Refused influenza vaccine   Well 50 y.o. male. Counseled on diet and exercise. I will give him 6 weeks to make some changes and lose some weight. DASH diet info given.  Lesion on eye looks like a pinguecula. He is to let us know if anything changes and we will refer to ophtho.  Immunizations, labs, and further orders as above. Follow up in 1 year pending the above workup. The patient voiced understanding and agreement to the plan.  [place], DO 12/05/15 3:26 PM

## 2015-12-05 NOTE — Patient Instructions (Addendum)
DASH Eating Plan  DASH stands for "Dietary Approaches to Stop Hypertension." The DASH eating plan is a healthy eating plan that has been shown to reduce high blood pressure (hypertension). Additional health benefits may include reducing the risk of type 2 diabetes mellitus, heart disease, and stroke. The DASH eating plan may also help with weight loss.  WHAT DO I NEED TO KNOW ABOUT THE DASH EATING PLAN?  For the DASH eating plan, you will follow these general guidelines:  · Choose foods with a percent daily value for sodium of less than 5% (as listed on the food label).  · Use salt-free seasonings or herbs instead of table salt or sea salt.  · Check with your health care provider or pharmacist before using salt substitutes.  · Eat lower-sodium products, often labeled as "lower sodium" or "no salt added."  · Eat fresh foods.  · Eat more vegetables, fruits, and low-fat dairy products.  · Choose whole grains. Look for the word "whole" as the first word in the ingredient list.  · Choose fish and skinless chicken or turkey more often than red meat. Limit fish, poultry, and meat to 6 oz (170 g) each day.  · Limit sweets, desserts, sugars, and sugary drinks.  · Choose heart-healthy fats.  · Limit cheese to 1 oz (28 g) per day.  · Eat more home-cooked food and less restaurant, buffet, and fast food.  · Limit fried foods.  · Cook foods using methods other than frying.  · Limit canned vegetables. If you do use them, rinse them well to decrease the sodium.  · When eating at a restaurant, ask that your food be prepared with less salt, or no salt if possible.  WHAT FOODS CAN I EAT?  Seek help from a dietitian for individual calorie needs.  Grains  Whole grain or whole wheat bread. Brown rice. Whole grain or whole wheat pasta. Quinoa, bulgur, and whole grain cereals. Low-sodium cereals. Corn or whole wheat flour tortillas. Whole grain cornbread. Whole grain crackers. Low-sodium crackers.  Vegetables  Fresh or frozen vegetables  (raw, steamed, roasted, or grilled). Low-sodium or reduced-sodium tomato and vegetable juices. Low-sodium or reduced-sodium tomato sauce and paste. Low-sodium or reduced-sodium canned vegetables.   Fruits  All fresh, canned (in natural juice), or frozen fruits.  Meat and Other Protein Products  Ground beef (85% or leaner), grass-fed beef, or beef trimmed of fat. Skinless chicken or turkey. Ground chicken or turkey. Pork trimmed of fat. All fish and seafood. Eggs. Dried beans, peas, or lentils. Unsalted nuts and seeds. Unsalted canned beans.  Dairy  Low-fat dairy products, such as skim or 1% milk, 2% or reduced-fat cheeses, low-fat ricotta or cottage cheese, or plain low-fat yogurt. Low-sodium or reduced-sodium cheeses.  Fats and Oils  Tub margarines without trans fats. Light or reduced-fat mayonnaise and salad dressings (reduced sodium). Avocado. Safflower, olive, or canola oils. Natural peanut or almond butter.  Other  Unsalted popcorn and pretzels.  The items listed above may not be a complete list of recommended foods or beverages. Contact your dietitian for more options.  WHAT FOODS ARE NOT RECOMMENDED?  Grains  White bread. White pasta. White rice. Refined cornbread. Bagels and croissants. Crackers that contain trans fat.  Vegetables  Creamed or fried vegetables. Vegetables in a cheese sauce. Regular canned vegetables. Regular canned tomato sauce and paste. Regular tomato and vegetable juices.  Fruits  Dried fruits. Canned fruit in light or heavy syrup. Fruit juice.  Meat and Other Protein   Products  Fatty cuts of meat. Ribs, chicken wings, bacon, sausage, bologna, salami, chitterlings, fatback, hot dogs, bratwurst, and packaged luncheon meats. Salted nuts and seeds. Canned beans with salt.  Dairy  Whole or 2% milk, cream, half-and-half, and cream cheese. Whole-fat or sweetened yogurt. Full-fat cheeses or blue cheese. Nondairy creamers and whipped toppings. Processed cheese, cheese spreads, or cheese  curds.  Condiments  Onion and garlic salt, seasoned salt, table salt, and sea salt. Canned and packaged gravies. Worcestershire sauce. Tartar sauce. Barbecue sauce. Teriyaki sauce. Soy sauce, including reduced sodium. Steak sauce. Fish sauce. Oyster sauce. Cocktail sauce. Horseradish. Ketchup and mustard. Meat flavorings and tenderizers. Bouillon cubes. Hot sauce. Tabasco sauce. Marinades. Taco seasonings. Relishes.  Fats and Oils  Butter, stick margarine, lard, shortening, ghee, and bacon fat. Coconut, palm kernel, or palm oils. Regular salad dressings.  Other  Pickles and olives. Salted popcorn and pretzels.  The items listed above may not be a complete list of foods and beverages to avoid. Contact your dietitian for more information.  WHERE CAN I FIND MORE INFORMATION?  National Heart, Lung, and [employer] Institute: www.nhlbi.nih.gov/health/health-topics/topics/dash/     This information is not intended to replace advice given to you by your health care provider. Make sure you discuss any questions you have with your health care provider.     Document Released: 01/25/2011 Document Revised: 02/26/2014 Document Reviewed: 12/10/2012  Elsevier Interactive Patient Education ©2016 [employer]

## 2015-12-05 NOTE — Progress Notes (Signed)
Pre visit review using our clinic review tool, if applicable. No additional management support is needed unless otherwise documented below in the visit note. 

## 2015-12-06 LAB — COMPREHENSIVE METABOLIC PANEL
ALT: 20 U/L (ref 0–53)
AST: 21 U/L (ref 0–37)
Albumin: 4.2 g/dL (ref 3.5–5.2)
Alkaline Phosphatase: 70 U/L (ref 39–117)
BILIRUBIN TOTAL: 0.6 mg/dL (ref 0.2–1.2)
BUN: 17 mg/dL (ref 6–23)
CALCIUM: 9.8 mg/dL (ref 8.4–10.5)
CO2: 32 mEq/L (ref 19–32)
CREATININE: 1.16 mg/dL (ref 0.40–1.50)
Chloride: 103 mEq/L (ref 96–112)
GFR: 85.97 mL/min (ref >60.00)
GLUCOSE: 103 mg/dL — AB (ref 70–99)
Potassium: 4.1 mEq/L (ref 3.5–5.1)
Sodium: 140 mEq/L (ref 135–145)
Total Protein: 7.7 g/dL (ref 6.0–8.3)

## 2015-12-06 LAB — LIPID PANEL
Cholesterol: 213 mg/dL — ABNORMAL HIGH (ref 0–200)
HDL: 40 mg/dL (ref >39.00)
LDL Cholesterol: 133 mg/dL — ABNORMAL HIGH (ref 0–99)
NONHDL: 173.05
Total CHOL/HDL Ratio: 5
Triglycerides: 199 mg/dL — ABNORMAL HIGH (ref 0.0–149.0)
VLDL: 39.8 mg/dL (ref 0.0–40.0)

## 2015-12-06 LAB — MICROALBUMIN / CREATININE URINE RATIO
Creatinine,U: 374.9 mg/dL
MICROALB UR: 4 mg/dL — AB (ref 0.0–1.9)
MICROALB/CREAT RATIO: 1.1 mg/g (ref 0.0–30.0)

## 2015-12-06 LAB — HIV ANTIBODY (ROUTINE TESTING W REFLEX): HIV: NONREACTIVE

## 2016-01-18 ENCOUNTER — Encounter: Payer: Self-pay | Admitting: Family Medicine

## 2016-01-18 ENCOUNTER — Ambulatory Visit (INDEPENDENT_AMBULATORY_CARE_PROVIDER_SITE_OTHER): Payer: BLUE CROSS/BLUE SHIELD | Admitting: Family Medicine

## 2016-01-18 DIAGNOSIS — E669 Obesity, unspecified: Secondary | ICD-10-CM

## 2016-01-18 DIAGNOSIS — I10 Essential (primary) hypertension: Secondary | ICD-10-CM | POA: Diagnosis not present

## 2016-01-18 DIAGNOSIS — Z6837 Body mass index (BMI) 37.0-37.9, adult: Secondary | ICD-10-CM | POA: Diagnosis not present

## 2016-01-18 DIAGNOSIS — E6609 Other obesity due to excess calories: Secondary | ICD-10-CM | POA: Diagnosis not present

## 2016-01-18 HISTORY — DX: Obesity, unspecified: E66.9

## 2016-01-18 HISTORY — DX: Essential (primary) hypertension: I10

## 2016-01-18 NOTE — Patient Instructions (Addendum)
Take your blood pressure around 1-2 times per week and write it down. Bring the readings into your next appointment. If you get readings >130/90, schedule an appointment earlier.

## 2016-01-18 NOTE — Progress Notes (Signed)
Pre visit review using our clinic review tool, if applicable. No additional management support is needed unless otherwise documented below in the visit note. 

## 2016-01-18 NOTE — Progress Notes (Signed)
Chief Complaint  Patient presents with  . Follow-up    Subjective Alex Gray is a 50 y.o. male who presents for hypertension follow up. He does not monitor home blood pressures. He is compliant with medications. Patient has these side effects of medication: none He is improving his diet.  Current exercise: walking, pushups   Past Medical History:  Diagnosis Date  . Carpal tunnel syndrome   . Essential hypertension 01/18/2016  . Obesity 01/18/2016   Family History  Problem Relation Age of Onset  . Diabetes Mother   . Hypertension Father   . Diabetes Father    Medications Current Outpatient Prescriptions on File Prior to Visit  Medication Sig Dispense Refill  . pantoprazole (PROTONIX) 20 MG tablet Take 1 tablet (20 mg total) by mouth daily. 14 tablet 0   Allergies No Known Allergies  Exam BP 118/76 (BP Location: Left Arm, Patient Position: Sitting, Cuff Size: Large)   Pulse 68   Temp 98.1 F (36.7 C) (Oral)   Ht 6\' 2"  (1.88 m)   Wt 289 lb (131.1 kg)   SpO2 96%   BMI 37.11 kg/m  General:  well developed, well nourished, in no apparent distress Skin:  warm, no pallor or diaphoresis Eyes:  pupils equal and round, sclera anicteric without injection Neck: neck supple without adenopathy, thyromegaly, masses, or bruits  Lungs:  clear to auscultation, breath sounds equal bilaterally Psych: well oriented with normal range of affect and appropriate judgment/insight  Essential hypertension  Class 2 obesity due to excess calories without serious comorbidity with body mass index (BMI) of 37.0 to 37.9 in adult  Orders as above. OK to hold off on medication for now. Get home BP readings and schedule appt if BP's >130/90 on average. Continue great work with dieting changes and exercise. Letter for work given. F/u in 3 mo. The patient voiced understanding and agreement to the plan.  [place], DO 01/18/16  2:35 PM

## 2016-02-02 ENCOUNTER — Encounter (HOSPITAL_BASED_OUTPATIENT_CLINIC_OR_DEPARTMENT_OTHER): Payer: Self-pay | Admitting: *Deleted

## 2016-02-02 ENCOUNTER — Emergency Department (HOSPITAL_BASED_OUTPATIENT_CLINIC_OR_DEPARTMENT_OTHER)
Admission: EM | Admit: 2016-02-02 | Discharge: 2016-02-02 | Disposition: A | Discharge status: Home / Self Care | Payer: BLUE CROSS/BLUE SHIELD | Attending: Emergency Medicine | Admitting: Emergency Medicine

## 2016-02-02 DIAGNOSIS — I10 Essential (primary) hypertension: Secondary | ICD-10-CM | POA: Diagnosis not present

## 2016-02-02 DIAGNOSIS — R040 Epistaxis: Secondary | ICD-10-CM

## 2016-02-02 NOTE — ED Notes (Signed)
C/o bleeding from rt nare off and on x 1 day  No bleeding at present

## 2016-02-02 NOTE — ED Provider Notes (Signed)
[facility] Provider Note   CSN: [id number] Arrival date & time: 02/02/16  0026     History   Chief Complaint Chief Complaint  Patient presents with  . Epistaxis    HPI Alex Gray is a 50 y.o. male.  The history is provided by the patient.  Epistaxis   This is a new problem. The current episode started 12 to 24 hours ago. The problem has been gradually improving. The problem is associated with an unknown factor. The bleeding has been from the right nare. He has tried applying pressure and ice for the symptoms. The treatment provided significant relief. His past medical history does not include bleeding disorder or frequent nosebleeds.  denies trauma No anti-coagulants   Past Medical History:  Diagnosis Date  . Carpal tunnel syndrome   . Essential hypertension 01/18/2016  . Obesity 01/18/2016    Patient Active Problem List   Diagnosis Date Noted  . Essential hypertension 01/18/2016  . Obesity 01/18/2016    Past Surgical History:  Procedure Laterality Date  . KNEE ARTHROSCOPY         Home Medications    Prior to Admission medications   Medication Sig Start Date End Date Taking? Authorizing Provider  pantoprazole (PROTONIX) 20 MG tablet Take 1 tablet (20 mg total) by mouth daily. 12/01/15   Delsa Grana, PA-C    Family History Family History  Problem Relation Age of Onset  . Diabetes Mother   . Hypertension Father   . Diabetes Father     Social History Social History  Substance Use Topics  . Smoking status: Never Smoker  . Smokeless tobacco: Never Used  . Alcohol use No     Allergies   Patient has no known allergies.   Review of Systems Review of Systems  Constitutional: Negative for fever.  HENT: Positive for nosebleeds.   Neurological: Negative for dizziness.     Physical Exam Updated Vital Signs BP (!) 158/101   Pulse 86   Temp 97.4 F (36.3 C)   Resp 16   Ht 6\' 3"  (1.905 m)   Wt 127 kg   SpO2 97%   BMI 35.00  kg/m   Physical Exam CONSTITUTIONAL: Well developed/well nourished HEAD: Normocephalic/atraumatic EYES: EOMI/PERRL ENMT: Mucous membranes moist, small clot noted in right nare No blood noted in oropharynx.  No active bleeding NECK: supple no meningeal signs SPINE/BACK:entire spine nontender CV: S1/S2 noted, no murmurs/rubs/gallops noted LUNGS: Lungs are clear to auscultation bilaterally, no apparent distress ABDOMEN: soft, nontender NEURO: Pt is awake/alert/appropriate, moves all extremitiesx4.  No facial droop.   SKIN: warm, color normal PSYCH: no abnormalities of mood noted, alert and oriented to situation   ED Treatments / Results  Labs (all labs ordered are listed, but only abnormal results are displayed) Labs Reviewed - No data to display  EKG  EKG Interpretation None       Radiology No results found.  Procedures Procedures (including critical care time)  Medications Ordered in ED Medications - No data to display   Initial Impression / Assessment and Plan / ED Course  I have reviewed the triage vital signs and the nursing notes.    Clinical Course     Pt without any active bleeding He was able to remove clot in nose, no active bleeding and no site for me to cauterize Will d/c home Pt agreeable with plan  Final Clinical Impressions(s) / ED Diagnoses   Final diagnoses:  Right-sided epistaxis    New Prescriptions  New Prescriptions   No medications on file     Ripley Fraise, MD 02/02/16 [id number]

## 2016-02-02 NOTE — ED Triage Notes (Signed)
Pt c/o right nare nose bleed x 1 day, no bleeding noted in triage.

## 2016-02-24 DIAGNOSIS — S4981XA Other specified injuries of right shoulder and upper arm, initial encounter: Secondary | ICD-10-CM | POA: Diagnosis not present

## 2016-02-25 DIAGNOSIS — M25561 Pain in right knee: Secondary | ICD-10-CM | POA: Diagnosis not present

## 2016-02-25 DIAGNOSIS — M5441 Lumbago with sciatica, right side: Secondary | ICD-10-CM | POA: Diagnosis not present

## 2016-02-25 DIAGNOSIS — M79661 Pain in right lower leg: Secondary | ICD-10-CM | POA: Diagnosis not present

## 2016-02-25 DIAGNOSIS — M545 Low back pain: Secondary | ICD-10-CM | POA: Diagnosis not present

## 2016-03-01 DIAGNOSIS — R52 Pain, unspecified: Secondary | ICD-10-CM | POA: Diagnosis not present

## 2016-03-01 DIAGNOSIS — M545 Low back pain: Secondary | ICD-10-CM | POA: Diagnosis not present

## 2016-03-01 DIAGNOSIS — M5416 Radiculopathy, lumbar region: Secondary | ICD-10-CM | POA: Diagnosis not present

## 2016-03-09 DIAGNOSIS — M545 Low back pain: Secondary | ICD-10-CM | POA: Diagnosis not present

## 2016-03-09 DIAGNOSIS — R269 Unspecified abnormalities of gait and mobility: Secondary | ICD-10-CM | POA: Diagnosis not present

## 2016-03-09 DIAGNOSIS — M48061 Spinal stenosis, lumbar region without neurogenic claudication: Secondary | ICD-10-CM | POA: Diagnosis not present

## 2016-03-09 DIAGNOSIS — M5416 Radiculopathy, lumbar region: Secondary | ICD-10-CM | POA: Diagnosis not present

## 2016-03-09 DIAGNOSIS — M4807 Spinal stenosis, lumbosacral region: Secondary | ICD-10-CM | POA: Diagnosis not present

## 2016-03-14 DIAGNOSIS — M545 Low back pain: Secondary | ICD-10-CM | POA: Diagnosis not present

## 2016-03-14 DIAGNOSIS — R269 Unspecified abnormalities of gait and mobility: Secondary | ICD-10-CM | POA: Diagnosis not present

## 2016-03-15 DIAGNOSIS — M48061 Spinal stenosis, lumbar region without neurogenic claudication: Secondary | ICD-10-CM | POA: Diagnosis not present

## 2016-03-19 DIAGNOSIS — R269 Unspecified abnormalities of gait and mobility: Secondary | ICD-10-CM | POA: Diagnosis not present

## 2016-03-19 DIAGNOSIS — M545 Low back pain: Secondary | ICD-10-CM | POA: Diagnosis not present

## 2016-03-22 DIAGNOSIS — R269 Unspecified abnormalities of gait and mobility: Secondary | ICD-10-CM | POA: Diagnosis not present

## 2016-03-22 DIAGNOSIS — M25651 Stiffness of right hip, not elsewhere classified: Secondary | ICD-10-CM | POA: Diagnosis not present

## 2016-03-22 DIAGNOSIS — M545 Low back pain: Secondary | ICD-10-CM | POA: Diagnosis not present

## 2016-03-22 DIAGNOSIS — M25652 Stiffness of left hip, not elsewhere classified: Secondary | ICD-10-CM | POA: Diagnosis not present

## 2016-03-26 DIAGNOSIS — M25651 Stiffness of right hip, not elsewhere classified: Secondary | ICD-10-CM | POA: Diagnosis not present

## 2016-03-26 DIAGNOSIS — M545 Low back pain: Secondary | ICD-10-CM | POA: Diagnosis not present

## 2016-03-26 DIAGNOSIS — R269 Unspecified abnormalities of gait and mobility: Secondary | ICD-10-CM | POA: Diagnosis not present

## 2016-03-26 DIAGNOSIS — M25652 Stiffness of left hip, not elsewhere classified: Secondary | ICD-10-CM | POA: Diagnosis not present

## 2016-03-28 DIAGNOSIS — M25651 Stiffness of right hip, not elsewhere classified: Secondary | ICD-10-CM | POA: Diagnosis not present

## 2016-03-28 DIAGNOSIS — R269 Unspecified abnormalities of gait and mobility: Secondary | ICD-10-CM | POA: Diagnosis not present

## 2016-03-28 DIAGNOSIS — M25652 Stiffness of left hip, not elsewhere classified: Secondary | ICD-10-CM | POA: Diagnosis not present

## 2016-03-28 DIAGNOSIS — M545 Low back pain: Secondary | ICD-10-CM | POA: Diagnosis not present

## 2016-04-02 DIAGNOSIS — R269 Unspecified abnormalities of gait and mobility: Secondary | ICD-10-CM | POA: Diagnosis not present

## 2016-04-02 DIAGNOSIS — M25651 Stiffness of right hip, not elsewhere classified: Secondary | ICD-10-CM | POA: Diagnosis not present

## 2016-04-02 DIAGNOSIS — M545 Low back pain: Secondary | ICD-10-CM | POA: Diagnosis not present

## 2016-04-02 DIAGNOSIS — M25652 Stiffness of left hip, not elsewhere classified: Secondary | ICD-10-CM | POA: Diagnosis not present

## 2016-04-05 DIAGNOSIS — M25651 Stiffness of right hip, not elsewhere classified: Secondary | ICD-10-CM | POA: Diagnosis not present

## 2016-04-05 DIAGNOSIS — M25652 Stiffness of left hip, not elsewhere classified: Secondary | ICD-10-CM | POA: Diagnosis not present

## 2016-04-05 DIAGNOSIS — M545 Low back pain: Secondary | ICD-10-CM | POA: Diagnosis not present

## 2016-04-05 DIAGNOSIS — R269 Unspecified abnormalities of gait and mobility: Secondary | ICD-10-CM | POA: Diagnosis not present

## 2016-04-11 DIAGNOSIS — M545 Low back pain: Secondary | ICD-10-CM | POA: Diagnosis not present

## 2016-04-17 DIAGNOSIS — M545 Low back pain: Secondary | ICD-10-CM | POA: Diagnosis not present

## 2016-04-18 DIAGNOSIS — M48062 Spinal stenosis, lumbar region with neurogenic claudication: Secondary | ICD-10-CM | POA: Diagnosis not present

## 2016-04-18 DIAGNOSIS — M5136 Other intervertebral disc degeneration, lumbar region: Secondary | ICD-10-CM | POA: Diagnosis not present

## 2016-04-19 DIAGNOSIS — M545 Low back pain: Secondary | ICD-10-CM | POA: Diagnosis not present

## 2016-04-26 DIAGNOSIS — M545 Low back pain: Secondary | ICD-10-CM | POA: Diagnosis not present

## 2016-05-02 DIAGNOSIS — M5136 Other intervertebral disc degeneration, lumbar region: Secondary | ICD-10-CM | POA: Diagnosis not present

## 2016-05-02 DIAGNOSIS — M48062 Spinal stenosis, lumbar region with neurogenic claudication: Secondary | ICD-10-CM | POA: Diagnosis not present

## 2016-06-01 DIAGNOSIS — M48062 Spinal stenosis, lumbar region with neurogenic claudication: Secondary | ICD-10-CM | POA: Diagnosis not present

## 2016-06-01 DIAGNOSIS — M5136 Other intervertebral disc degeneration, lumbar region: Secondary | ICD-10-CM | POA: Diagnosis not present

## 2016-07-13 DIAGNOSIS — M48062 Spinal stenosis, lumbar region with neurogenic claudication: Secondary | ICD-10-CM | POA: Diagnosis not present

## 2016-07-13 DIAGNOSIS — M5136 Other intervertebral disc degeneration, lumbar region: Secondary | ICD-10-CM | POA: Diagnosis not present

## 2016-07-19 ENCOUNTER — Encounter (HOSPITAL_BASED_OUTPATIENT_CLINIC_OR_DEPARTMENT_OTHER): Payer: Self-pay | Admitting: *Deleted

## 2016-07-19 ENCOUNTER — Emergency Department (HOSPITAL_BASED_OUTPATIENT_CLINIC_OR_DEPARTMENT_OTHER)
Admission: EM | Admit: 2016-07-19 | Discharge: 2016-07-19 | Disposition: A | Discharge status: Home / Self Care | Payer: BLUE CROSS/BLUE SHIELD | Attending: Emergency Medicine | Admitting: Emergency Medicine

## 2016-07-19 DIAGNOSIS — R42 Dizziness and giddiness: Secondary | ICD-10-CM | POA: Diagnosis not present

## 2016-07-19 DIAGNOSIS — Z79899 Other long term (current) drug therapy: Secondary | ICD-10-CM | POA: Diagnosis not present

## 2016-07-19 DIAGNOSIS — I10 Essential (primary) hypertension: Secondary | ICD-10-CM | POA: Diagnosis not present

## 2016-07-19 LAB — CBC WITH DIFFERENTIAL/PLATELET
BASOS PCT: 0 %
Basophils Absolute: 0 10*3/uL (ref 0.0–0.1)
Eosinophils Absolute: 0 10*3/uL (ref 0.0–0.7)
Eosinophils Relative: 1 %
HCT: 47.5 % (ref 39.0–52.0)
HEMOGLOBIN: 15.7 g/dL (ref 13.0–17.0)
Lymphocytes Relative: 32 %
Lymphs Abs: 1.5 10*3/uL (ref 0.7–4.0)
MCH: 26.5 pg (ref 26.0–34.0)
MCHC: 33.1 g/dL (ref 30.0–36.0)
MCV: 80.2 fL (ref 78.0–100.0)
MONOS PCT: 8 %
Monocytes Absolute: 0.4 10*3/uL (ref 0.1–1.0)
NEUTROS PCT: 59 %
Neutro Abs: 2.8 10*3/uL (ref 1.7–7.7)
Platelets: 248 10*3/uL (ref 150–400)
RBC: 5.92 MIL/uL — ABNORMAL HIGH (ref 4.22–5.81)
RDW: 14.9 % (ref 11.5–15.5)
WBC: 4.7 10*3/uL (ref 4.0–10.5)

## 2016-07-19 LAB — URINALYSIS, ROUTINE W REFLEX MICROSCOPIC
BILIRUBIN URINE: NEGATIVE
Glucose, UA: NEGATIVE mg/dL
Hgb urine dipstick: NEGATIVE
KETONES UR: NEGATIVE mg/dL
LEUKOCYTES UA: NEGATIVE
NITRITE: NEGATIVE
PH: 7 (ref 5.0–8.0)
Protein, ur: NEGATIVE mg/dL
Specific Gravity, Urine: 1.01 (ref 1.005–1.030)

## 2016-07-19 LAB — BASIC METABOLIC PANEL
Anion gap: 8 (ref 5–15)
BUN: 14 mg/dL (ref 6–20)
CALCIUM: 9.3 mg/dL (ref 8.9–10.3)
CO2: 27 mmol/L (ref 22–32)
CREATININE: 0.89 mg/dL (ref 0.61–1.24)
Chloride: 103 mmol/L (ref 101–111)
GFR calc non Af Amer: >60 mL/min (ref >60)
Glucose, Bld: 99 mg/dL (ref 65–99)
Potassium: 3.8 mmol/L (ref 3.5–5.1)
Sodium: 138 mmol/L (ref 135–145)

## 2016-07-19 LAB — CBG MONITORING, ED: Glucose-Capillary: 104 mg/dL — ABNORMAL HIGH (ref 65–99)

## 2016-07-19 LAB — TROPONIN I

## 2016-07-19 NOTE — ED Triage Notes (Addendum)
Dizziness x 2 days. Increased urination. Left lateral chest wall pain. Pain radiates to his back. Skin feels dry. His mom died 2 weeks ago. He thinks his symptoms might be stress related.

## 2016-07-19 NOTE — Discharge Instructions (Signed)
Follow-up with your primary Dr. in the next week if symptoms are not improving, and return to the ER if symptoms significantly worsen or change.

## 2016-07-19 NOTE — ED Notes (Signed)
ED Provider at bedside. 

## 2016-07-19 NOTE — ED Provider Notes (Signed)
[facility] Provider Note   CSN: [id number] Arrival date & time: 07/19/16  1319     History   Chief Complaint Chief Complaint  Patient presents with  . Dizziness    HPI Alex Gray is a 51 y.o. male.  Patient is a 51 year old male with no past medical history. He presents for evaluation of lightheadedness. He has been feeling this way for the past several days. He also reports frequent urination, however no burning or pain. He denies any chest pain or difficulty breathing.    The history is provided by the patient.  Dizziness  Quality:  Lightheadedness Severity:  Moderate Onset quality:  Sudden Duration:  2 days Timing:  Constant Progression:  Worsening Chronicity:  New Relieved by:  Nothing Worsened by:  Nothing Ineffective treatments:  None tried   Past Medical History:  Diagnosis Date  . Carpal tunnel syndrome   . Essential hypertension 01/18/2016  . Obesity 01/18/2016    Patient Active Problem List   Diagnosis Date Noted  . Essential hypertension 01/18/2016  . Obesity 01/18/2016    Past Surgical History:  Procedure Laterality Date  . KNEE ARTHROSCOPY         Home Medications    Prior to Admission medications   Medication Sig Start Date End Date Taking? Authorizing Provider  pantoprazole (PROTONIX) 20 MG tablet Take 1 tablet (20 mg total) by mouth daily. 12/01/15   Delsa Grana, PA-C    Family History Family History  Problem Relation Age of Onset  . Diabetes Mother   . Hypertension Father   . Diabetes Father     Social History Social History  Substance Use Topics  . Smoking status: Never Smoker  . Smokeless tobacco: Never Used  . Alcohol use No     Allergies   Patient has no known allergies.   Review of Systems Review of Systems  Neurological: Positive for dizziness.  All other systems reviewed and are negative.    Physical Exam Updated Vital Signs BP (!) 150/100   Pulse 64   Temp 98.3 F (36.8 C) (Oral)    Resp 20   Ht 6\' 3"  (1.905 m)   Wt 127 kg (280 lb)   SpO2 97%   BMI 35.00 kg/m   Physical Exam  Constitutional: He is oriented to person, place, and time. He appears well-developed and well-nourished. No distress.  HENT:  Head: Normocephalic and atraumatic.  Mouth/Throat: Oropharynx is clear and moist.  Eyes: EOM are normal. Pupils are equal, round, and reactive to light.  Neck: Normal range of motion. Neck supple.  Cardiovascular: Normal rate and regular rhythm.  Exam reveals no friction rub.   No murmur heard. Pulmonary/Chest: Effort normal and breath sounds normal. No respiratory distress. He has no wheezes. He has no rales.  Abdominal: Soft. Bowel sounds are normal. He exhibits no distension. There is no tenderness.  Musculoskeletal: Normal range of motion. He exhibits no edema.  Neurological: He is alert and oriented to person, place, and time. No cranial nerve deficit. He exhibits normal muscle tone. Coordination normal.  Skin: Skin is warm and dry. He is not diaphoretic.  Nursing note and vitals reviewed.    ED Treatments / Results  Labs (all labs ordered are listed, but only abnormal results are displayed) Labs Reviewed  CBC WITH DIFFERENTIAL/PLATELET - Abnormal; Notable for the following:       Result Value   RBC 5.92 (*)    All other components within normal limits  CBG  MONITORING, ED - Abnormal; Notable for the following:    Glucose-Capillary 104 (*)    All other components within normal limits  URINALYSIS, ROUTINE W REFLEX MICROSCOPIC  BASIC METABOLIC PANEL  TROPONIN I    EKG  EKG Interpretation  Date/Time:  Thursday Jul 19 2016 13:34:05 EDT Ventricular Rate:  88 PR Interval:  142 QRS Duration: 64 QT Interval:  330 QTC Calculation: 399 R Axis:   27 Text Interpretation:  Sinus rhythm with Premature atrial complexes Anterior infarct , age undetermined Abnormal ECG Confirmed by Veryl Speak [phone]) on 07/19/2016 1:40:01 PM       Radiology No results  found.  Procedures Procedures (including critical care time)  Medications Ordered in ED Medications - No data to display   Initial Impression / Assessment and Plan / ED Course  I have reviewed the triage vital signs and the nursing notes.  Pertinent labs & imaging results that were available during my care of the patient were reviewed by me and considered in my medical decision making (see chart for details).  EKG is normal and troponin is negative, electrolytes, blood sugar, and blood counts are all normal. I see no obvious cause for his lightheadedness or urinary frequency, however nothing today appears emergent. He does tell me that his mother passed away 22 weeks ago and believes that this could be stress related.  Final Clinical Impressions(s) / ED Diagnoses   Final diagnoses:  None    New Prescriptions New Prescriptions   No medications on file     Veryl Speak, MD 07/19/16 [phone]

## 2016-07-20 ENCOUNTER — Encounter: Payer: Self-pay | Admitting: Family Medicine

## 2016-07-20 ENCOUNTER — Ambulatory Visit (INDEPENDENT_AMBULATORY_CARE_PROVIDER_SITE_OTHER): Payer: BLUE CROSS/BLUE SHIELD | Admitting: Family Medicine

## 2016-07-20 VITALS — BP 136/90 | HR 80 | Temp 97.8°F | Ht 74.0 in | Wt 286.8 lb

## 2016-07-20 DIAGNOSIS — K5900 Constipation, unspecified: Secondary | ICD-10-CM | POA: Diagnosis not present

## 2016-07-20 NOTE — Progress Notes (Signed)
Chief Complaint  Patient presents with  . Follow-up    for lightheaded, and freq,urination    Subjective Alex Gray is a 51 y.o. male who presents for lightheadedness/frequent urination.  Both of these issues started 2-3 days ago. He has been urinating 12-13 times per day, states it is a fair amount. It is better today. No change in PO intake of fluids or dietary change. Of note, he does state that his bowel movements have been less frequent and smaller/rounder since this started. He denies any personal or family hx of issues with his prostate. No pain, bleeding, urinary discharge, fevers, or weak stream.   Past Medical History:  Diagnosis Date  . Carpal tunnel syndrome   . Essential hypertension 01/18/2016  . Obesity 01/18/2016   Family History  Problem Relation Age of Onset  . Diabetes Mother   . Hypertension Father   . Diabetes Father      Medications Taking no medications routinely.  Allergies No Known Allergies  Review of Systems Const: No fevers GU: As noted in HPI  Exam BP 136/90 (BP Location: Left Arm, Patient Position: Sitting, Cuff Size: Large)   Pulse 80   Temp 97.8 F (36.6 C) (Oral)   Ht 6\' 2"  (1.88 m)   Wt 286 lb 12.8 oz (130.1 kg)   SpO2 96%   BMI 36.82 kg/m  General:  well developed, well nourished, in no apparent distress Skin:  warm, no pallor or diaphoresis HEENT: MMM Heart :RRR, no murmurs Lungs:  clear to auscultation, no accessory muscle use Abd: Soft, BS+, TTP in LLQ, no suprapubic region, non-distended, no masses or organomegaly Psych: well oriented with normal range of affect and appropriate judgment/insight  Constipation, unspecified constipation type  Orders as above. I believe that stool in colon is pushing against his bladder causing decreased volume compared to usual. Will tx constipation with laxative and enema if no improvement after 3 days. If no improvement, will obtain A1c and discuss DRE for prostate examination. I did offer  both of these today, but he opted to try treating the constipation first.  BP elevated today, sounds like when he checks at home, it is also elevated. Check at home and bring readings to next appt. F/u in 1 mo for this. The patient voiced understanding and agreement to the plan.  [place], DO 07/21/16  1:24 PM

## 2016-07-20 NOTE — Patient Instructions (Addendum)
Miralax daily for 3 days, if no improvement, use an enema.  Bring your blood pressure log to your next appointment.

## 2016-08-29 ENCOUNTER — Ambulatory Visit: Payer: BLUE CROSS/BLUE SHIELD | Admitting: Family Medicine

## 2016-09-07 ENCOUNTER — Ambulatory Visit: Payer: BLUE CROSS/BLUE SHIELD | Admitting: Family Medicine

## 2016-09-07 DIAGNOSIS — M48062 Spinal stenosis, lumbar region with neurogenic claudication: Secondary | ICD-10-CM | POA: Diagnosis not present

## 2016-09-07 DIAGNOSIS — Z0289 Encounter for other administrative examinations: Secondary | ICD-10-CM

## 2016-09-07 DIAGNOSIS — M5136 Other intervertebral disc degeneration, lumbar region: Secondary | ICD-10-CM | POA: Diagnosis not present

## 2016-10-03 DIAGNOSIS — M5136 Other intervertebral disc degeneration, lumbar region: Secondary | ICD-10-CM | POA: Diagnosis not present

## 2016-10-03 DIAGNOSIS — M48062 Spinal stenosis, lumbar region with neurogenic claudication: Secondary | ICD-10-CM | POA: Diagnosis not present

## 2016-10-09 DIAGNOSIS — L738 Other specified follicular disorders: Secondary | ICD-10-CM | POA: Diagnosis not present

## 2016-11-08 DIAGNOSIS — L738 Other specified follicular disorders: Secondary | ICD-10-CM | POA: Diagnosis not present

## 2016-11-19 DIAGNOSIS — Z79899 Other long term (current) drug therapy: Secondary | ICD-10-CM | POA: Diagnosis not present

## 2016-11-19 DIAGNOSIS — M5136 Other intervertebral disc degeneration, lumbar region: Secondary | ICD-10-CM | POA: Diagnosis not present

## 2016-11-19 DIAGNOSIS — M48062 Spinal stenosis, lumbar region with neurogenic claudication: Secondary | ICD-10-CM | POA: Diagnosis not present

## 2016-11-19 DIAGNOSIS — M79604 Pain in right leg: Secondary | ICD-10-CM | POA: Diagnosis not present

## 2016-12-03 DIAGNOSIS — M48062 Spinal stenosis, lumbar region with neurogenic claudication: Secondary | ICD-10-CM | POA: Diagnosis not present

## 2016-12-03 DIAGNOSIS — M5136 Other intervertebral disc degeneration, lumbar region: Secondary | ICD-10-CM | POA: Diagnosis not present

## 2016-12-13 DIAGNOSIS — M48062 Spinal stenosis, lumbar region with neurogenic claudication: Secondary | ICD-10-CM | POA: Diagnosis not present

## 2016-12-13 DIAGNOSIS — M5136 Other intervertebral disc degeneration, lumbar region: Secondary | ICD-10-CM | POA: Diagnosis not present

## 2016-12-18 DIAGNOSIS — M48062 Spinal stenosis, lumbar region with neurogenic claudication: Secondary | ICD-10-CM | POA: Diagnosis not present

## 2016-12-18 DIAGNOSIS — M5136 Other intervertebral disc degeneration, lumbar region: Secondary | ICD-10-CM | POA: Diagnosis not present

## 2016-12-24 DIAGNOSIS — M5136 Other intervertebral disc degeneration, lumbar region: Secondary | ICD-10-CM | POA: Diagnosis not present

## 2016-12-24 DIAGNOSIS — M48062 Spinal stenosis, lumbar region with neurogenic claudication: Secondary | ICD-10-CM | POA: Diagnosis not present

## 2016-12-27 DIAGNOSIS — M48062 Spinal stenosis, lumbar region with neurogenic claudication: Secondary | ICD-10-CM | POA: Diagnosis not present

## 2016-12-27 DIAGNOSIS — M5136 Other intervertebral disc degeneration, lumbar region: Secondary | ICD-10-CM | POA: Diagnosis not present

## 2017-01-01 DIAGNOSIS — M4726 Other spondylosis with radiculopathy, lumbar region: Secondary | ICD-10-CM | POA: Diagnosis not present

## 2017-01-02 DIAGNOSIS — M5136 Other intervertebral disc degeneration, lumbar region: Secondary | ICD-10-CM | POA: Diagnosis not present

## 2017-01-02 DIAGNOSIS — M48062 Spinal stenosis, lumbar region with neurogenic claudication: Secondary | ICD-10-CM | POA: Diagnosis not present

## 2017-01-07 DIAGNOSIS — M48062 Spinal stenosis, lumbar region with neurogenic claudication: Secondary | ICD-10-CM | POA: Diagnosis not present

## 2017-01-07 DIAGNOSIS — M5136 Other intervertebral disc degeneration, lumbar region: Secondary | ICD-10-CM | POA: Diagnosis not present

## 2017-01-09 DIAGNOSIS — M48062 Spinal stenosis, lumbar region with neurogenic claudication: Secondary | ICD-10-CM | POA: Diagnosis not present

## 2017-01-09 DIAGNOSIS — M5136 Other intervertebral disc degeneration, lumbar region: Secondary | ICD-10-CM | POA: Diagnosis not present

## 2017-01-15 DIAGNOSIS — M4726 Other spondylosis with radiculopathy, lumbar region: Secondary | ICD-10-CM | POA: Diagnosis not present

## 2017-01-15 DIAGNOSIS — M48062 Spinal stenosis, lumbar region with neurogenic claudication: Secondary | ICD-10-CM | POA: Diagnosis not present

## 2017-02-08 ENCOUNTER — Ambulatory Visit (INDEPENDENT_AMBULATORY_CARE_PROVIDER_SITE_OTHER): Payer: BLUE CROSS/BLUE SHIELD | Admitting: Family Medicine

## 2017-02-08 ENCOUNTER — Encounter: Payer: Self-pay | Admitting: Family Medicine

## 2017-02-08 VITALS — BP 140/90 | HR 64 | Temp 98.1°F | Ht 75.0 in | Wt 297.0 lb

## 2017-02-08 DIAGNOSIS — I10 Essential (primary) hypertension: Secondary | ICD-10-CM | POA: Diagnosis not present

## 2017-02-08 MED ORDER — AMLODIPINE BESYLATE 5 MG PO TABS: 5.0000 mg | ORAL_TABLET | Freq: Every day | ORAL | 5 refills | Status: DC
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
## 2017-02-08 NOTE — Progress Notes (Signed)
Pre visit review using our clinic review tool, if applicable. No additional management support is needed unless otherwise documented below in the visit note. 

## 2017-02-08 NOTE — Patient Instructions (Addendum)
Around 3 times per week, check your blood pressure 4 times per day. Twice in the morning and twice in the evening. The readings should be at least one minute apart. Write down these values and bring them to your next nurse visit/appointment.  When you check your BP, make sure you have been doing something calm/relaxing 5 minutes prior to checking. Both feet should be flat on the floor and you should be sitting. Use your left arm and make sure it is in a relaxed position (on a table), and that the cuff is at the approximate level/height of your heart.   DASH Eating Plan DASH stands for "Dietary Approaches to Stop Hypertension." The DASH eating plan is a healthy eating plan that has been shown to reduce high blood pressure (hypertension). It may also reduce your risk for type 2 diabetes, heart disease, and stroke. The DASH eating plan may also help with weight loss. What are tips for following this plan? General guidelines  Avoid eating more than 2,300 mg (milligrams) of salt (sodium) a day. If you have hypertension, you may need to reduce your sodium intake to 1,500 mg a day.  Limit alcohol intake to no more than 1 drink a day for nonpregnant women and 2 drinks a day for men. One drink equals 12 oz of beer, 5 oz of wine, or 1 oz of hard liquor.  Work with your health care provider to maintain a healthy body weight or to lose weight. Ask what an ideal weight is for you.  Get at least 30 minutes of exercise that causes your heart to beat faster (aerobic exercise) most days of the week. Activities may include walking, swimming, or biking.  Work with your health care provider or diet and nutrition specialist (dietitian) to adjust your eating plan to your individual calorie needs. Reading food labels  Check food labels for the amount of sodium per serving. Choose foods with less than 5 percent of the Daily Value of sodium. Generally, foods with less than 300 mg of sodium per serving fit into this  eating plan.  To find whole grains, look for the word "whole" as the first word in the ingredient list. Shopping  Buy products labeled as "low-sodium" or "no salt added."  Buy fresh foods. Avoid canned foods and premade or frozen meals. Cooking  Avoid adding salt when cooking. Use salt-free seasonings or herbs instead of table salt or sea salt. Check with your health care provider or pharmacist before using salt substitutes.  Do not fry foods. Cook foods using healthy methods such as baking, boiling, grilling, and broiling instead.  Cook with heart-healthy oils, such as olive, canola, soybean, or sunflower oil. Meal planning   Eat a balanced diet that includes: ? 5 or more servings of fruits and vegetables each day. At each meal, try to fill half of your plate with fruits and vegetables. ? Up to 6-8 servings of whole grains each day. ? Less than 6 oz of lean meat, poultry, or fish each day. A 3-oz serving of meat is about the same size as a deck of cards. One egg equals 1 oz. ? 2 servings of low-fat dairy each day. ? A serving of nuts, seeds, or beans 5 times each week. ? Heart-healthy fats. Healthy fats called Omega-3 fatty acids are found in foods such as flaxseeds and coldwater fish, like sardines, salmon, and mackerel.  Limit how much you eat of the following: ? Canned or prepackaged foods. ? Food that  is high in trans fat, such as fried foods. ? Food that is high in saturated fat, such as fatty meat. ? Sweets, desserts, sugary drinks, and other foods with added sugar. ? Full-fat dairy products.  Do not salt foods before eating.  Try to eat at least 2 vegetarian meals each week.  Eat more home-cooked food and less restaurant, buffet, and fast food.  When eating at a restaurant, ask that your food be prepared with less salt or no salt, if possible. What foods are recommended? The items listed may not be a complete list. Talk with your dietitian about what dietary choices  are best for you. Grains Whole-grain or whole-wheat bread. Whole-grain or whole-wheat pasta. Brown rice. [place]. Bulgur. Whole-grain and low-sodium cereals. Pita bread. Low-fat, low-sodium crackers. Whole-wheat flour tortillas. Vegetables Fresh or frozen vegetables (raw, steamed, roasted, or grilled). Low-sodium or reduced-sodium tomato and vegetable juice. Low-sodium or reduced-sodium tomato sauce and tomato paste. Low-sodium or reduced-sodium canned vegetables. Fruits All fresh, dried, or frozen fruit. Canned fruit in natural juice (without added sugar). Meat and other protein foods Skinless chicken or [place]. Ground chicken or [place]. Pork with fat trimmed off. Fish and seafood. Egg whites. Dried beans, peas, or lentils. Unsalted nuts, nut butters, and seeds. Unsalted canned beans. Lean cuts of beef with fat trimmed off. Low-sodium, lean deli meat. Dairy Low-fat (1%) or fat-free (skim) milk. Fat-free, low-fat, or reduced-fat cheeses. Nonfat, low-sodium ricotta or cottage cheese. Low-fat or nonfat yogurt. Low-fat, low-sodium cheese. Fats and oils Soft margarine without trans fats. Vegetable oil. Low-fat, reduced-fat, or light mayonnaise and salad dressings (reduced-sodium). Canola, safflower, olive, soybean, and sunflower oils. Avocado. Seasoning and other foods Herbs. Spices. Seasoning mixes without salt. Unsalted popcorn and pretzels. Fat-free sweets. What foods are not recommended? The items listed may not be a complete list. Talk with your dietitian about what dietary choices are best for you. Grains Baked goods made with fat, such as croissants, muffins, or some breads. Dry pasta or rice meal packs. Vegetables Creamed or fried vegetables. Vegetables in a cheese sauce. Regular canned vegetables (not low-sodium or reduced-sodium). Regular canned tomato sauce and paste (not low-sodium or reduced-sodium). Regular tomato and vegetable juice (not low-sodium or reduced-sodium). Angie Fava.  Olives. Fruits Canned fruit in a light or heavy syrup. Fried fruit. Fruit in cream or butter sauce. Meat and other protein foods Fatty cuts of meat. Ribs. Fried meat. Berniece Salines. Sausage. Bologna and other processed lunch meats. Salami. Fatback. Hotdogs. Bratwurst. Salted nuts and seeds. Canned beans with added salt. Canned or smoked fish. Whole eggs or egg yolks. Chicken or [place] with skin. Dairy Whole or 2% milk, cream, and half-and-half. Whole or full-fat cream cheese. Whole-fat or sweetened yogurt. Full-fat cheese. Nondairy creamers. Whipped toppings. Processed cheese and cheese spreads. Fats and oils Butter. Stick margarine. Lard. Shortening. Ghee. Bacon fat. Tropical oils, such as coconut, palm kernel, or palm oil. Seasoning and other foods Salted popcorn and pretzels. Onion salt, garlic salt, seasoned salt, table salt, and sea salt. Worcestershire sauce. Tartar sauce. Barbecue sauce. Teriyaki sauce. Soy sauce, including reduced-sodium. Steak sauce. Canned and packaged gravies. Fish sauce. Oyster sauce. Cocktail sauce. Horseradish that you find on the shelf. Ketchup. Mustard. Meat flavorings and tenderizers. Bouillon cubes. Hot sauce and Tabasco sauce. Premade or packaged marinades. Premade or packaged taco seasonings. Relishes. Regular salad dressings. Where to find more information:  National Heart, Lung, and [place]: https://wilson-eaton.com/  American Heart Association: www.heart.org Summary  The DASH eating plan is a healthy  eating plan that has been shown to reduce high blood pressure (hypertension). It may also reduce your risk for type 2 diabetes, heart disease, and stroke.  With the DASH eating plan, you should limit salt (sodium) intake to 2,300 mg a day. If you have hypertension, you may need to reduce your sodium intake to 1,500 mg a day.  When on the DASH eating plan, aim to eat more fresh fruits and vegetables, whole grains, lean proteins, low-fat dairy, and heart-healthy  fats.  Work with your health care provider or diet and nutrition specialist (dietitian) to adjust your eating plan to your individual calorie needs. This information is not intended to replace advice given to you by your health care provider. Make sure you discuss any questions you have with your health care provider. Document Released: 01/25/2011 Document Revised: 01/30/2016 Document Reviewed: 01/30/2016 Elsevier Interactive Patient Education  [employer].

## 2017-02-20 DIAGNOSIS — Z7689 Persons encountering health services in other specified circumstances: Secondary | ICD-10-CM | POA: Diagnosis not present

## 2017-02-25 IMAGING — CR DG WRIST 2V*R*
2 series · 2 of 2 positions shown · non-contrast
Comparison: None.

CLINICAL DATA: Patient with right wrist pain for multiple days. No
definite traumatic injury.

EXAM:
RIGHT WRIST - 2 VIEW

[x wrist pa right]
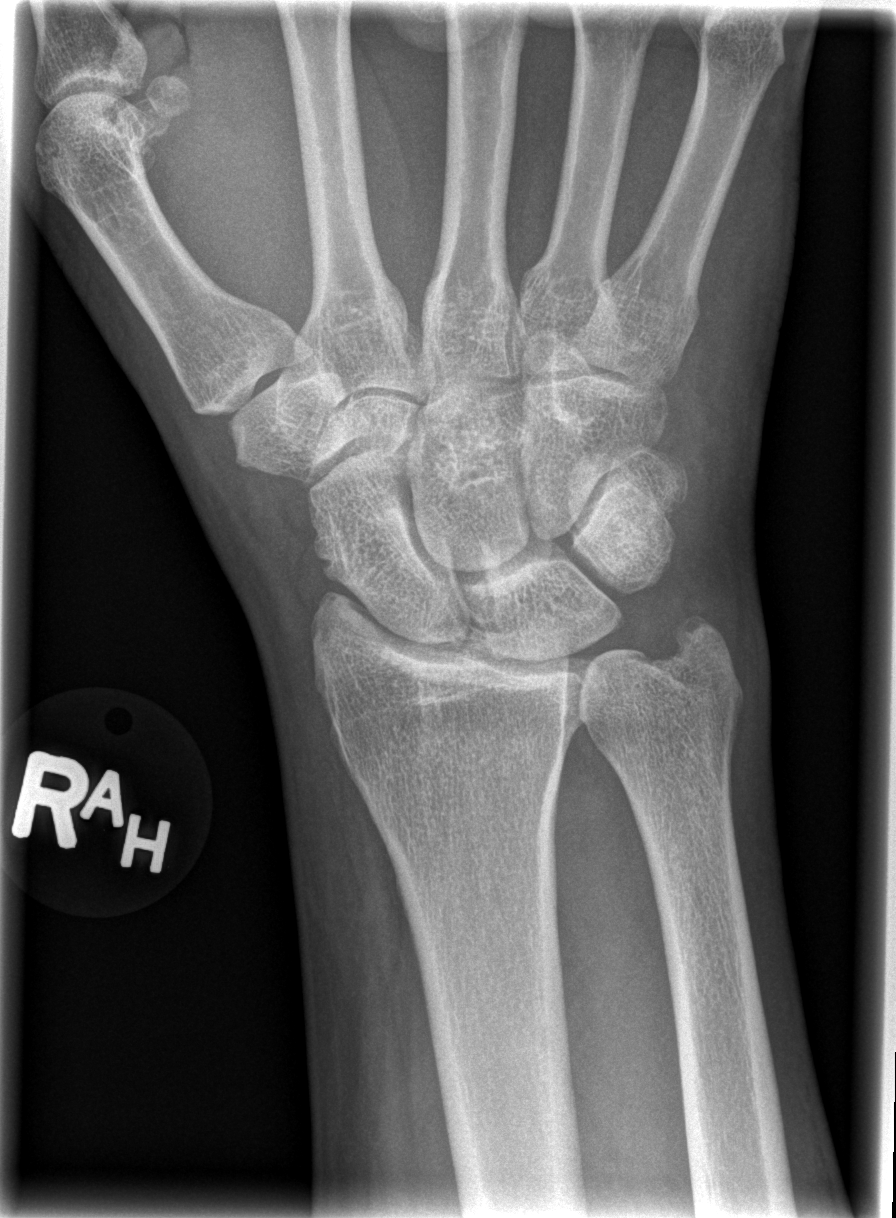

[x wrist obl right]
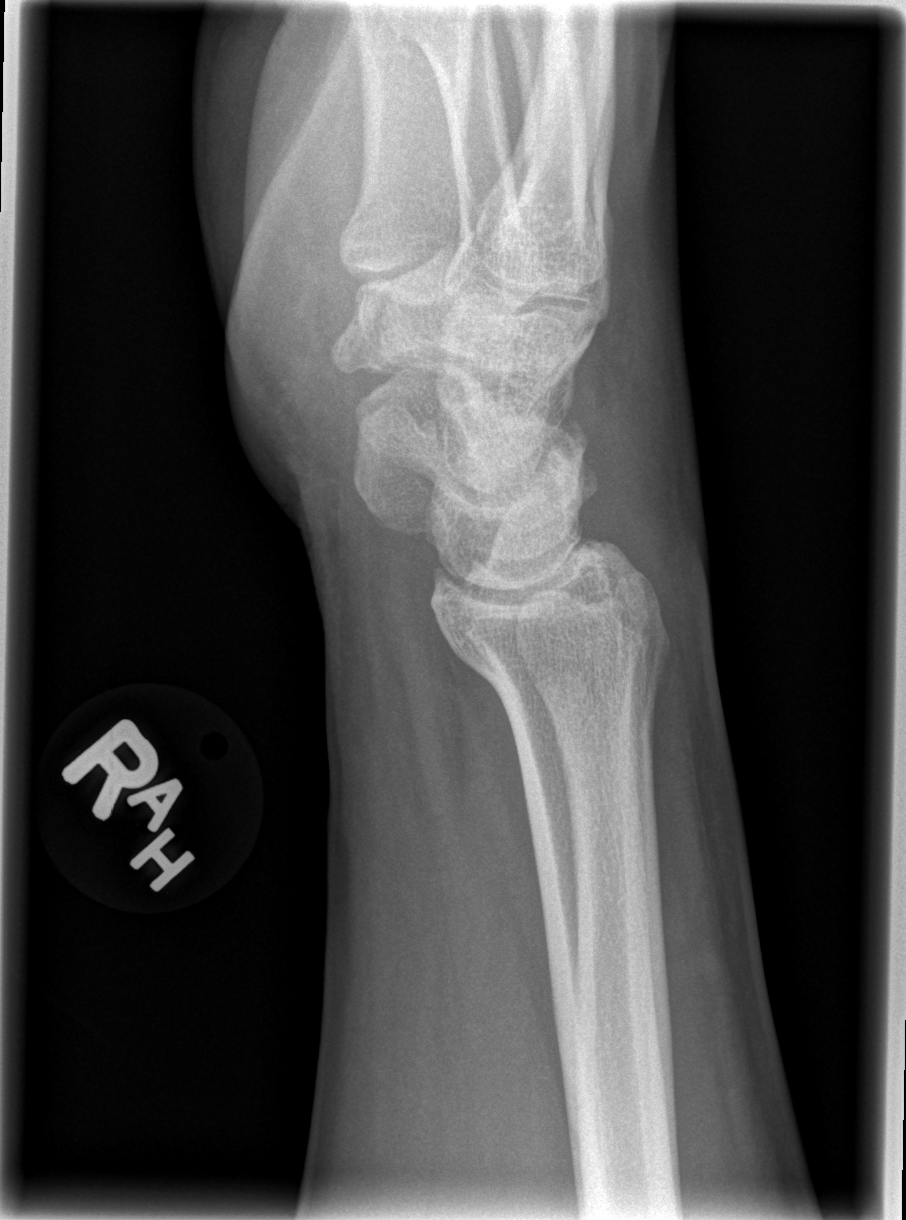

[2 of 2 positions shown; findings below may reference images not displayed]

FINDINGS: AP and lateral views of the wrist demonstrate no evidence for acute
fracture. Joint space narrowing at the radiocarpal joint with
associated subchondral sclerosis involving the distal aspect of the
radius. The soft tissue abnormality.
IMPRESSION: No acute osseous abnormality.

Radiocarpal joint degenerative change.

## 2017-03-15 ENCOUNTER — Ambulatory Visit: Payer: BLUE CROSS/BLUE SHIELD | Admitting: Family Medicine

## 2017-03-19 DIAGNOSIS — M4726 Other spondylosis with radiculopathy, lumbar region: Secondary | ICD-10-CM | POA: Diagnosis not present

## 2017-03-19 DIAGNOSIS — M48062 Spinal stenosis, lumbar region with neurogenic claudication: Secondary | ICD-10-CM | POA: Diagnosis not present

## 2017-03-19 DIAGNOSIS — M5136 Other intervertebral disc degeneration, lumbar region: Secondary | ICD-10-CM | POA: Diagnosis not present

## 2017-03-20 ENCOUNTER — Ambulatory Visit: Payer: BLUE CROSS/BLUE SHIELD | Admitting: Family Medicine

## 2017-04-10 ENCOUNTER — Encounter: Payer: Self-pay | Admitting: Family Medicine

## 2017-04-10 ENCOUNTER — Ambulatory Visit (INDEPENDENT_AMBULATORY_CARE_PROVIDER_SITE_OTHER): Payer: BLUE CROSS/BLUE SHIELD | Admitting: Family Medicine

## 2017-04-10 VITALS — BP 130/82 | HR 85 | Temp 98.6°F | Ht 75.0 in | Wt 298.0 lb

## 2017-04-10 DIAGNOSIS — M7022 Olecranon bursitis, left elbow: Secondary | ICD-10-CM

## 2017-04-10 DIAGNOSIS — K219 Gastro-esophageal reflux disease without esophagitis: Secondary | ICD-10-CM

## 2017-04-10 DIAGNOSIS — I10 Essential (primary) hypertension: Secondary | ICD-10-CM

## 2017-04-10 MED ORDER — PANTOPRAZOLE SODIUM 40 MG PO TBEC: 40.0000 mg | DELAYED_RELEASE_TABLET | Freq: Every day | ORAL | 1 refills | Status: DC

## 2017-04-10 NOTE — Patient Instructions (Addendum)
The only lifestyle changes that have data behind them are weight loss for the overweight/obese and elevating the head of the bed. Finding out which foods/positions are triggers is important.  Your BP looks good. Until I see you next, OK to check BP weekly.  Aim to do some physical exertion for 150 minutes per week. This is typically divided into 5 days per week, 30 minutes per day. The activity should be enough to get your heart rate up. Anything is better than nothing if you have time constraints.  Keep the diet clean.  Let us know if you need anything.

## 2017-04-10 NOTE — Progress Notes (Signed)
Chief Complaint  Patient presents with  . Gastroesophageal Reflux    GERD Alex Gray is a 52 y.o. male who presents for evaluation of GERD. Symptoms began 1 mo. He denies abdominal bloating, bilious reflux, upper abdominal discomfort, cough, dysphagia, weight loss Aggravating factors and specific triggers include eating anything. Alleviating factors include H2 blockers Risk factors present for GERD include obesity.  Previous workup includes none.  Hypertension Patient presents for hypertension follow up. He does monitor home blood pressures. Blood pressures ranging on average from 120-130's/70-80's. He is compliant with medications- Norvasc 5 mg/d. Patient has these side effects of medication: none He is adhering to a healthy diet overall. Exercise: going back to gym lifting wts and getting some cardio  Has olecranon bursitis, getting better but size is lingering. No pain currently, no redness. He has been icing it.    Past Medical History:  Diagnosis Date  . Carpal tunnel syndrome   . Essential hypertension 01/18/2016  . Obesity 01/18/2016   Medications Current Outpatient Medications on File Prior to Visit  Medication Sig Dispense Refill  . amLODipine (NORVASC) 5 MG tablet Take 1 tablet (5 mg total) by mouth daily. 30 tablet 5   Allergies No Known Allergies Family History Family History  Problem Relation Age of Onset  . Diabetes Mother   . Hypertension Father   . Diabetes Father     Review of Systems Constitutional:  no unexpected change in weight, no unexplained fevers, sweats, or chills Gastrointestinal: as noted in HPI Hematologic/Lymphatic:  no night sweats, no swollen nodes  Exam BP 130/82 (BP Location: Left Arm, Patient Position: Sitting, Cuff Size: Large)   Pulse 85   Temp 98.6 F (37 C) (Oral)   Ht 6\' 3"  (1.905 m)   Wt 298 lb (135.2 kg)   SpO2 96%   BMI 37.25 kg/m  General:  well developed, well nourished, in no apparent distress Skin:  warm,  no pallor or diaphoresis Thorax:  nontender Lungs:  clear to auscultation, breath sounds equal bilaterally, no respiratory distress, no wheezes Cardio:  regular rate and rhythm, no bruits, no LE edema Abdomen:  abdomen soft, nontender; bowel sounds normal; no masses or organomegaly Psych: Well oriented with normal range of affect appropriate judgment/insight  Assessment and Plan  Gastroesophageal reflux disease, esophagitis presence not specified - Plan: pantoprazole (PROTONIX) 40 MG tablet  Essential hypertension  Olecranon bursitis of left elbow  Counseled on the diagnosis, course and treatment of the above conditions. PPI trial as above, 2 mo, then recheck. Monitor triggers, lose weight, elevate head of bed.  BP doing well, cont current meds, OK to back off on checking BP at home to weekly. Counseled on diet and exercise. Cont ice and supportive care for elbow, if worsening, will aspirate and inject steroid. Pt voiced understanding and agreement to the plan.  [place], DO 04/10/17  4:40 PM

## 2017-04-10 NOTE — Progress Notes (Signed)
Pre visit review using our clinic review tool, if applicable. No additional management support is needed unless otherwise documented below in the visit note. 

## 2017-06-04 ENCOUNTER — Other Ambulatory Visit: Payer: Self-pay | Admitting: Family Medicine

## 2017-06-04 DIAGNOSIS — K219 Gastro-esophageal reflux disease without esophagitis: Secondary | ICD-10-CM

## 2017-06-12 ENCOUNTER — Ambulatory Visit: Payer: BLUE CROSS/BLUE SHIELD | Admitting: Family Medicine

## 2017-06-12 DIAGNOSIS — Z0289 Encounter for other administrative examinations: Secondary | ICD-10-CM

## 2017-08-03 ENCOUNTER — Other Ambulatory Visit: Payer: Self-pay | Admitting: Family Medicine

## 2017-08-03 DIAGNOSIS — K219 Gastro-esophageal reflux disease without esophagitis: Secondary | ICD-10-CM

## 2017-08-03 DIAGNOSIS — I10 Essential (primary) hypertension: Secondary | ICD-10-CM

## 2017-09-19 ENCOUNTER — Ambulatory Visit: Payer: BLUE CROSS/BLUE SHIELD | Admitting: Family Medicine

## 2017-09-19 ENCOUNTER — Encounter: Payer: Self-pay | Admitting: Family Medicine

## 2017-09-19 VITALS — BP 138/92 | HR 76 | Temp 98.6°F | Ht 75.0 in | Wt 286.0 lb

## 2017-09-19 DIAGNOSIS — R358 Other polyuria: Secondary | ICD-10-CM

## 2017-09-19 DIAGNOSIS — R631 Polydipsia: Secondary | ICD-10-CM | POA: Diagnosis not present

## 2017-09-19 DIAGNOSIS — R3589 Other polyuria: Secondary | ICD-10-CM

## 2017-09-19 DIAGNOSIS — K5900 Constipation, unspecified: Secondary | ICD-10-CM

## 2017-09-19 DIAGNOSIS — I10 Essential (primary) hypertension: Secondary | ICD-10-CM | POA: Diagnosis not present

## 2017-09-19 LAB — HEMOGLOBIN A1C: HEMOGLOBIN A1C: 10.1 % — AB (ref 4.6–6.5)

## 2017-09-19 NOTE — Patient Instructions (Addendum)
Try MiraLAX 1-2 times daily over the next 3-4 days. If no improvement, try using an enema. Stay well hydrated and keep lots of fiber in your diet.  Keep the diet clean. Stay active.  Let us know if you need anything.  Healthy Eating Plan Many factors influence your heart health, including eating and exercise habits. Heart (coronary) risk increases with abnormal blood fat (lipid) levels. Heart-healthy meal planning includes limiting unhealthy fats, increasing healthy fats, and making other small dietary changes. This includes maintaining a healthy body weight to help keep lipid levels within a normal range.  WHAT IS MY PLAN?  Your health care provider recommends that you:  Drink a glass of water before meals to help with satiety.  Eat slowly.  An alternative to the water is to add Metamucil. This will help with satiety as well. It does contain calories, unlike water.  WHAT TYPES OF FAT SHOULD I CHOOSE?  Choose healthy fats more often. Choose monounsaturated and polyunsaturated fats, such as olive oil and canola oil, flaxseeds, walnuts, almonds, and seeds.  Eat more omega-3 fats. Good choices include salmon, mackerel, sardines, tuna, flaxseed oil, and ground flaxseeds. Aim to eat fish at least two times each week.  Avoid foods with partially hydrogenated oils in them. These contain trans fats. Examples of foods that contain trans fats are stick margarine, some tub margarines, cookies, crackers, and other baked goods. If you are going to avoid a fat, this is the one to avoid!  WHAT GENERAL GUIDELINES DO I NEED TO FOLLOW?  Check food labels carefully to identify foods with trans fats. Avoid these types of options when possible.  Fill one half of your plate with vegetables and green salads. Eat 4-5 servings of vegetables per day. A serving of vegetables equals 1 cup of raw leafy vegetables,  cup of raw or cooked cut-up vegetables, or  cup of vegetable juice.  Fill one fourth of your  plate with whole grains. Look for the word "whole" as the first word in the ingredient list.  Fill one fourth of your plate with lean protein foods.  Eat 4-5 servings of fruit per day. A serving of fruit equals one medium whole fruit,  cup of dried fruit,  cup of fresh, frozen, or canned fruit. Try to avoid fruits in cups/syrups as the sugar content can be high.  Eat more foods that contain soluble fiber. Examples of foods that contain this type of fiber are apples, broccoli, carrots, beans, peas, and barley. Aim to get 20-30 g of fiber per day.  Eat more home-cooked food and less restaurant, buffet, and fast food.  Limit or avoid alcohol.  Limit foods that are high in starch and sugar.  Avoid fried foods when able.  Cook foods by using methods other than frying. Baking, boiling, grilling, and broiling are all great options. Other fat-reducing suggestions include: ? Removing the skin from poultry. ? Removing all visible fats from meats. ? Skimming the fat off of stews, soups, and gravies before serving them. ? Steaming vegetables in water or broth.  Lose weight if you are overweight. Losing just 5-10% of your initial body weight can help your overall health and prevent diseases such as diabetes and heart disease.  Increase your consumption of nuts, legumes, and seeds to 4-5 servings per week. One serving of dried beans or legumes equals  cup after being cooked, one serving of nuts equals 1 ounces, and one serving of seeds equals  ounce or 1 tablespoon.  WHAT ARE GOOD FOODS CAN I EAT? Grains Grainy breads (try to find bread that is 3 g of fiber per slice or greater), oatmeal, light popcorn. Whole-grain cereals. Rice and pasta, including brown rice and those that are made with whole wheat. Edamame pasta is a great alternative to grain pasta. It has a higher protein content. Try to avoid significant consumption of white bread, sugary cereals, or pastries/baked goods.  Vegetables All  vegetables. Cooked white potatoes do not count as vegetables.  Fruits All fruits, but limit pineapple and bananas as these fruits have a higher sugar content.  Meats and Other Protein Sources Lean, well-trimmed beef, veal, pork, and lamb. Chicken and [place] without skin. All fish and shellfish. Wild duck, rabbit, pheasant, and venison. Egg whites or low-cholesterol egg substitutes. Dried beans, peas, lentils, and tofu.Seeds and most nuts.  Dairy Low-fat or nonfat cheeses, including ricotta, string, and mozzarella. Skim or 1% milk that is liquid, powdered, or evaporated. Buttermilk that is made with low-fat milk. Nonfat or low-fat yogurt. Soy/Almond milk are good alternatives if you cannot handle dairy.  Beverages Water is the best for you. Sports drinks with less sugar are more desirable unless you are a highly active athlete.  Sweets and Desserts Sherbets and fruit ices. Honey, jam, marmalade, jelly, and syrups. Dark chocolate.  Eat all sweets and desserts in moderation.  Fats and Oils Nonhydrogenated (trans-free) margarines. Vegetable oils, including soybean, sesame, sunflower, olive, peanut, safflower, corn, canola, and cottonseed. Salad dressings or mayonnaise that are made with a vegetable oil. Limit added fats and oils that you use for cooking, baking, salads, and as spreads.  Other Cocoa powder. Coffee and tea. Most condiments.  The items listed above may not be a complete list of recommended foods or beverages. Contact your dietitian for more options.

## 2017-09-19 NOTE — Progress Notes (Signed)
Chief Complaint  Patient presents with  . Follow-up    Subjective: Patient is a 52 y.o. male here for increased urination.  Over the past several weeks, the patient has been experiencing increased thirst, dry mouth, weight loss, and increased urination.  He does have a family history of diabetes.  He is concerned about this.  He denies any fevers, abdominal pain, bleeding, incomplete emptying.  The patient does not feel that his urine volume is decreased per urination.  Patient has a history of high blood pressure.  He is compliant with his Norvasc, 5 mg daily.  Diet and exercise could be better.  Over the past 4 days, the patient has not had a bowel movement.  He has not tried anything at home other than increasing his water intake.  ROS: Heart: Denies chest pain  Lungs: Denies SOB   Past Medical History:  Diagnosis Date  . Carpal tunnel syndrome   . Essential hypertension 01/18/2016  . Obesity 01/18/2016   Objective: BP (!) 138/92 (BP Location: Left Arm, Patient Position: Sitting, Cuff Size: Large)   Pulse 76   Temp 98.6 F (37 C) (Oral)   Ht 6\' 3"  (1.905 m)   Wt 286 lb (129.7 kg)   SpO2 96%   BMI 35.75 kg/m  General: Awake, appears stated age HEENT: MMM, EOMi Heart: RRR, no bruits, no LE edema Lungs: CTAB, no rales, wheezes or rhonchi. No accessory muscle use Abd: BS+, soft, nontender, nondistended, no masses or organomegaly Psych: Age appropriate judgment and insight, normal affect and mood  Assessment and Plan: Essential hypertension  Increased thirst - Plan: Hemoglobin A1c  Polyuria - Plan: Hemoglobin A1c  Constipation, unspecified constipation type  Check blood pressures at home, continue current medicine for now. Check A1c, I am very concerned that he has diabetes. Counseled on diet and exercise. Consider using an enema, MiraLAX 1-2 times daily for the next several days, if no improvement must use an enema. Follow-up in 1 week.  I anticipate we will need  to review his labs. The patient voiced understanding and agreement to the plan.  [place], DO 09/19/17  12:16 PM

## 2017-09-19 NOTE — Progress Notes (Signed)
Pre visit review using our clinic review tool, if applicable. No additional management support is needed unless otherwise documented below in the visit note. 

## 2017-09-26 ENCOUNTER — Encounter: Payer: Self-pay | Admitting: Family Medicine

## 2017-09-26 ENCOUNTER — Ambulatory Visit: Payer: BLUE CROSS/BLUE SHIELD | Admitting: Family Medicine

## 2017-09-26 VITALS — BP 142/90 | HR 92 | Temp 98.3°F | Ht 75.0 in | Wt 285.4 lb

## 2017-09-26 DIAGNOSIS — E119 Type 2 diabetes mellitus without complications: Secondary | ICD-10-CM

## 2017-09-26 DIAGNOSIS — E669 Obesity, unspecified: Secondary | ICD-10-CM | POA: Insufficient documentation

## 2017-09-26 DIAGNOSIS — E118 Type 2 diabetes mellitus with unspecified complications: Secondary | ICD-10-CM | POA: Diagnosis not present

## 2017-09-26 DIAGNOSIS — E1169 Type 2 diabetes mellitus with other specified complication: Secondary | ICD-10-CM

## 2017-09-26 DIAGNOSIS — Z23 Encounter for immunization: Secondary | ICD-10-CM

## 2017-09-26 LAB — MICROALBUMIN / CREATININE URINE RATIO
CREATININE, U: 50.9 mg/dL
MICROALB UR: 1 mg/dL (ref 0.0–1.9)
Microalb Creat Ratio: 2 mg/g (ref 0.0–30.0)

## 2017-09-26 LAB — COMPREHENSIVE METABOLIC PANEL
ALBUMIN: 4.2 g/dL (ref 3.5–5.2)
ALK PHOS: 108 U/L (ref 39–117)
ALT: 35 U/L (ref 0–53)
AST: 26 U/L (ref 0–37)
BUN: 16 mg/dL (ref 6–23)
CALCIUM: 9.9 mg/dL (ref 8.4–10.5)
CO2: 26 mEq/L (ref 19–32)
CREATININE: 1.05 mg/dL (ref 0.40–1.50)
Chloride: 99 mEq/L (ref 96–112)
GFR: 95.74 mL/min (ref >60.00)
Glucose, Bld: 421 mg/dL — ABNORMAL HIGH (ref 70–99)
POTASSIUM: 4.4 meq/L (ref 3.5–5.1)
SODIUM: 135 meq/L (ref 135–145)
TOTAL PROTEIN: 7.2 g/dL (ref 6.0–8.3)
Total Bilirubin: 0.8 mg/dL (ref 0.2–1.2)

## 2017-09-26 LAB — LIPID PANEL
CHOL/HDL RATIO: 6
Cholesterol: 205 mg/dL — ABNORMAL HIGH (ref 0–200)
HDL: 35.5 mg/dL — AB (ref >39.00)
LDL Cholesterol: 131 mg/dL — ABNORMAL HIGH (ref 0–99)
NonHDL: 169.6
Triglycerides: 195 mg/dL — ABNORMAL HIGH (ref 0.0–149.0)
VLDL: 39 mg/dL (ref 0.0–40.0)

## 2017-09-26 MED ORDER — ATORVASTATIN CALCIUM 40 MG PO TABS: 40.0000 mg | ORAL_TABLET | Freq: Every day | ORAL | 3 refills | Status: DC

## 2017-09-26 MED ORDER — ONETOUCH LANCETS MISC
1 refills | Status: DC
Start: 2017-09-26 — End: 2022-06-19

## 2017-09-26 MED ORDER — GLUCOSE BLOOD VI STRP: ORAL_STRIP | 1 refills | Status: DC

## 2017-09-26 MED ORDER — METFORMIN HCL 500 MG PO TABS
ORAL_TABLET | ORAL | 1 refills | Status: DC
Start: 2017-09-26 — End: 2017-10-22

## 2017-09-26 NOTE — Progress Notes (Signed)
Chief Complaint  Patient presents with  . Results    Subjective: Patient is a 52 y.o. male here for DM results.  Increased thrist and urination, found to have DM II. +famhx of this. Diet is cleaner since our meeting last week. Not on statin or DM meds. He does have some questions regarding his dx. Does not have a glucose meter. He does have an eye provider. Due for PCV23 and tdap. Reports that he is very anxious regarding new dx.  ROS: Heart: Denies chest pain  Lungs: Denies SOB   Past Medical History:  Diagnosis Date  . Carpal tunnel syndrome   . Essential hypertension 01/18/2016  . Obesity 01/18/2016    Objective: BP (!) 142/90 (BP Location: Left Arm, Patient Position: Sitting, Cuff Size: Large)   Pulse 92   Temp 98.3 F (36.8 C) (Oral)   Ht 6\' 3"  (1.905 m)   Wt 285 lb 6 oz (129.4 kg)   SpO2 95%   BMI 35.67 kg/m  General: Awake, appears stated age Lungs: No accessory muscle use Psych: Age appropriate judgment and insight, normal affect and mood  Assessment and Plan: Diabetes mellitus type 2 in obese ([place]) - Plan: Lipid panel, Comprehensive metabolic panel, atorvastatin (LIPITOR) 40 MG tablet, metFORMIN (GLUCOPHAGE) 500 MG tablet, Microalbumin / creatinine urine ratio, ONE TOUCH LANCETS MISC, glucose blood (ONETOUCH VERIO) test strip  Controlled type 2 diabetes mellitus with complication, without long-term current use of insulin ([place])  Need for tetanus booster - Plan: Tdap vaccine greater than or equal to 7yo IM  Need for vaccination against Streptococcus pneumoniae - Plan: Pneumococcal polysaccharide vaccine 23-valent greater than or equal to 2yo subcutaneous/IM  Orders as above. Start metformin, statin. Counseled on diet and exercise.  Glucometer given today and sugar monitoring at home.  Obtain urine sample and update imms. F/u in 6 weeks.  The patient voiced understanding and agreement to the plan.  Greater than 25 minutes were spent face to face with the  patient with greater than 50% of this time spent counseling on diabetes management, prognosis and maintenance.    [place], DO 09/26/17  1:08 PM

## 2017-09-26 NOTE — Patient Instructions (Addendum)
Consider the vegan and Mediterranean diet.  Let your eye provider know about your diagnosis and that you need to have a diabetes exam.  Give [place] 2-3 business days to get the results of your labs back.  Check your sugars around 2-3 times per week. Alternate checking in the morning before you eat, in the afternoon and before bed. Write them down and bring it to your next appointment.   Biotene mouthwash may help for dry mouth.  Stay hydrated.  Let us know if you need anything.

## 2017-09-26 NOTE — Progress Notes (Signed)
Pre visit review using our clinic review tool, if applicable. No additional management support is needed unless otherwise documented below in the visit note. 

## 2017-09-30 ENCOUNTER — Encounter: Payer: Self-pay | Admitting: Family Medicine

## 2017-10-01 NOTE — Telephone Encounter (Signed)
What level he should not go under and what level should not go over? What goal he should be between?

## 2017-10-08 ENCOUNTER — Other Ambulatory Visit: Payer: Self-pay | Admitting: Family Medicine

## 2017-10-08 DIAGNOSIS — K219 Gastro-esophageal reflux disease without esophagitis: Secondary | ICD-10-CM

## 2017-10-10 ENCOUNTER — Ambulatory Visit: Payer: BLUE CROSS/BLUE SHIELD

## 2017-10-15 ENCOUNTER — Ambulatory Visit (INDEPENDENT_AMBULATORY_CARE_PROVIDER_SITE_OTHER): Payer: BLUE CROSS/BLUE SHIELD | Admitting: Family Medicine

## 2017-10-15 ENCOUNTER — Other Ambulatory Visit: Payer: Self-pay

## 2017-10-15 VITALS — BP 148/112 | HR 88

## 2017-10-15 DIAGNOSIS — Z0130 Encounter for examination of blood pressure without abnormal findings: Secondary | ICD-10-CM | POA: Diagnosis not present

## 2017-10-15 DIAGNOSIS — I10 Essential (primary) hypertension: Secondary | ICD-10-CM

## 2017-10-15 MED ORDER — AMLODIPINE BESYLATE 5 MG PO TABS: ORAL_TABLET | ORAL | 0 refills | Status: DC

## 2017-10-15 NOTE — Patient Instructions (Signed)
Per Dr. Etter Sjogren, doctor of the day, increase amlodopine to 10mg  daily. Report any increased fatigue or other symptoms. Return to the office 9/30 for OV with PCP, Dr. Nani Ravens, to reevaluate change in therapy.

## 2017-10-22 ENCOUNTER — Other Ambulatory Visit: Payer: Self-pay | Admitting: Family Medicine

## 2017-10-22 DIAGNOSIS — E1169 Type 2 diabetes mellitus with other specified complication: Secondary | ICD-10-CM

## 2017-10-22 DIAGNOSIS — E669 Obesity, unspecified: Principal | ICD-10-CM

## 2017-11-08 ENCOUNTER — Ambulatory Visit: Payer: Self-pay

## 2017-11-08 NOTE — Telephone Encounter (Signed)
Attempted to contact dentist's office but no answer; left message on voicemail [phone] for pt to call back so that other options can be discussed.

## 2017-11-08 NOTE — Telephone Encounter (Signed)
Pt called with complaints of tooth pain because he has an abcess on his tooth and he is having pain; pt says he has a "pus-like thing over the tooth"; he says that the dentist scheduled him an appointment but it is far out; the pt states that he is at work and needed to get back; also says that it is ok to leave a detailed message on voicemail [phone].  Reason for Disposition . Toothache present > 24 hours  Protocols used: TOOTHACHE-A-AH

## 2017-11-13 ENCOUNTER — Other Ambulatory Visit: Payer: Self-pay | Admitting: Family Medicine

## 2017-11-13 DIAGNOSIS — I10 Essential (primary) hypertension: Secondary | ICD-10-CM

## 2017-11-14 ENCOUNTER — Ambulatory Visit: Payer: BLUE CROSS/BLUE SHIELD | Admitting: Family Medicine

## 2017-11-18 ENCOUNTER — Ambulatory Visit: Payer: BLUE CROSS/BLUE SHIELD | Admitting: Family Medicine

## 2017-11-22 ENCOUNTER — Ambulatory Visit: Payer: BLUE CROSS/BLUE SHIELD | Admitting: Family Medicine

## 2017-11-22 ENCOUNTER — Encounter: Payer: Self-pay | Admitting: Family Medicine

## 2017-11-22 VITALS — BP 132/84 | HR 94 | Temp 98.6°F | Ht 75.0 in | Wt 275.0 lb

## 2017-11-22 DIAGNOSIS — I10 Essential (primary) hypertension: Secondary | ICD-10-CM

## 2017-11-22 DIAGNOSIS — M7651 Patellar tendinitis, right knee: Secondary | ICD-10-CM

## 2017-11-22 MED ORDER — MELOXICAM 15 MG PO TABS: 15.0000 mg | ORAL_TABLET | Freq: Every day | ORAL | 0 refills | Status: DC

## 2017-11-22 NOTE — Progress Notes (Signed)
Musculoskeletal Exam  Patient: Alex Gray DOB: [birth year]  DOS: 11/22/2017  SUBJECTIVE:  Chief Complaint:   Chief Complaint  Patient presents with  . Knee Pain    Alex Gray is a 52 y.o.  male for evaluation and treatment of L knee pain.   Onset:  1 day ago.  Was running.  Character:  aching  Progression of issue:  is unchanged Associated symptoms: swelling, decreased ROM Treatment: to date has been none.   Neurovascular symptoms: no  Hypertension Patient presents for hypertension follow up. He does monitor home blood pressures. Blood pressures ranging on average from 120's/70's. He is compliant with medication- Norvasc 5 mg/d. Patient has these side effects of medication: none He is adhering to a healthy diet overall. Exercise: running  ROS: Musculoskeletal/Extremities: +R knee pain Neuro: No weakness  Past Medical History:  Diagnosis Date  . Carpal tunnel syndrome   . Essential hypertension 01/18/2016  . Obesity 01/18/2016    Objective: VITAL SIGNS: BP 132/84 (BP Location: Left Arm, Patient Position: Sitting, Cuff Size: Large)   Pulse 94   Temp 98.6 F (37 C) (Oral)   Ht 6\' 3"  (1.905 m)   Wt 275 lb (124.7 kg)   SpO2 93%   BMI 34.37 kg/m  Constitutional: Well formed, well developed. No acute distress. Cardiovascular: RRR, no LE edema, no bruits Thorax & Lungs: No accessory muscle use, CTAB Musculoskeletal: R knee Normal active range of motion: no.   Normal passive range of motion: no Tenderness to palpation: yes, over patellar tendon Deformity: no Ecchymosis: no Tests positive: None Tests negative: Pat app/grind, Lachman's, varus/valgus, Stine's Neurologic: Normal sensory function. No focal deficits noted.  Psychiatric: Normal mood. Age appropriate judgment and insight. Alert & oriented x 3.    Assessment:  Essential hypertension  Patellar tendinitis of right knee - Plan: meloxicam (MOBIC) 15 MG tablet  Plan: Cont Norvasc. Home readings  sound good. Will keep things the same. Likely dx for knee pain. Rehab exercises/stretches, ice, nsaid, Tylenol. F/u 6 weeks for DM visit- he has done an excellent job losing weight and changing his life around. The patient voiced understanding and agreement to the plan.   [place], DO 11/22/17  2:01 PM

## 2017-11-22 NOTE — Patient Instructions (Addendum)
OK to take Tylenol 1000 mg (2 extra strength tabs) or 975 mg (3 regular strength tabs) every 6 hours as needed.  Ice/cold pack over area for 10-15 min twice daily.  Keep up the good work!  Patellar Tendinitis Rehab Do exercises exactly as told by your health care provider and adjust them as directed. It is normal to feel mild stretching, pulling, tightness, or discomfort as you do these exercises, but you should stop right away if you feel sudden pain or your pain gets worse. Do not begin these exercises until told by your health care provider. Stretching and range of motion exercises This exercise warms up your muscles and joints and improves the movement and flexibility of your knee. This exercise also helps to relieve pain and stiffness. Exercise A: Hamstring, doorway  1. Lie on your back in front of a doorway with your R leg resting against the wall and your other leg flat on the floor in the doorway. There should be a slight bend in your knee. 2. Straighten your knee. You should feel a stretch behind your knee or thigh. If you do not, scoot your buttocks closer to the door. 3. Hold this position for 30 seconds. Repeat2 times. Complete this stretch 3 times per week. Strengthening exercises These exercises build strength and endurance in your knee. Endurance is the ability to use your muscles for a long time, even after they get tired. Exercise B: Quadriceps, isometric  1. Lie on your back with your R leg extended and your other knee bent. 2. Slowly tense the muscles in the front of your  thigh. When you do this, you should see your kneecap slide up toward your hip or see increased dimpling just above the knee. This motion will push the back of your knee toward the floor. If this is painful, try putting a rolled-up hand towel under your knee to support it in a bent position. Change the size of the towel to find a position that allows you to do this exercise without any pain. 3. For 3  seconds, hold the muscle as tight as you can without increasing your pain. 4. Relax the muscles slowly and completely. Repeat 2 times. Complete this exercise 3 times per week. Exercise C: Straight leg raises ( quadriceps) 1. Lie on your back with your R leg extended and your other knee bent. 2. Tense the muscles in the front of your R thigh. When you do this, you should see your kneecap slide up or see increased dimpling just above the knee. 3. Keep these muscles tight as you raise your leg 4-6 inches (10-15 cm) off the floor. Do not let your moving knee bend. 4. Hold this position for 1 second. 5. Keep these muscles tense as you slowly lower your leg. 6. Relax your muscles slowly and completely. Repeat 2 times. Complete this exercise 3 times per week Exercise D: Squats 1. Stand in front of a table, with your feet and knees pointing straight ahead. You may rest your hands on the table for balance but not for support. 2. Slowly bend your knees and lower your hips like you are going to sit in a chair. ? Keep your weight over your heels, not over your toes. ? Keep your lower legs upright so they are parallel with the table legs. ? Do not let your hips go lower than your knees. ? Do not bend lower than told by your health care provider. ? If your knee pain increases,  do not bend as low. 3. Hold the squat position for 1 seconds. 4. Slowly push with your legs to return to standing. Do not use your hands to pull yourself to standing. Repeat 2 times. Complete this exercise 3 times per week. Exercise E: Step-downs 1. Stand on the edge of a step. 2. Keeping your weight over your R heel, slowly bend your R knee to bring your R heel toward the floor. Lower your heel as far as you can while keeping control and without increasing any discomfort. ? Do not let your knee come forward. ? Use your leg muscles, not gravity, to lower your body. ? Hold a wall or rail for balance if needed. 3. Slowly push  through your heel to lift your body weight back up. 4. Return to the starting position. Repeat2 times. Complete this exercise 3 times per week. Exercise F: Straight leg raises ( hip abductors) 1. Lie on your side with your R leg in the top position. Lie so your head, shoulder, knee, and hip line up. You may bend your lower knee to help you keep your balance. 2. Roll your hips slightly forward, so that your hips are stacked directly over each other and your R knee is facing forward. 3. Leading with your heel, lift your top leg 4-6 inches (10-15 cm). You should feel the muscles in your outer hip lifting. ? Do not let your foot drift forward. ? Do not let your knee roll toward the ceiling. 4. Hold this position for 3 seconds. 5. Slowly lower your leg to the starting position. 6. Let your muscles relax completely after each repetition. Repeat 2 times. Complete this exercise 3 times per week. This information is not intended to replace advice given to you by your health care provider. Make sure you discuss any questions you have with your health care provider. Document Released: 02/05/2005 Document Revised: 10/13/2015 Document Reviewed: 11/09/2014 Elsevier Interactive Patient Education  [employer].

## 2017-11-22 NOTE — Progress Notes (Signed)
Pre visit review using our clinic review tool, if applicable. No additional management support is needed unless otherwise documented below in the visit note. 

## 2017-11-24 ENCOUNTER — Other Ambulatory Visit: Payer: Self-pay | Admitting: Family Medicine

## 2017-11-24 DIAGNOSIS — E1169 Type 2 diabetes mellitus with other specified complication: Secondary | ICD-10-CM

## 2017-11-24 DIAGNOSIS — E669 Obesity, unspecified: Principal | ICD-10-CM

## 2017-12-04 ENCOUNTER — Other Ambulatory Visit: Payer: Self-pay | Admitting: Family Medicine

## 2017-12-04 DIAGNOSIS — E1169 Type 2 diabetes mellitus with other specified complication: Secondary | ICD-10-CM

## 2017-12-04 DIAGNOSIS — E669 Obesity, unspecified: Principal | ICD-10-CM

## 2017-12-16 ENCOUNTER — Other Ambulatory Visit: Payer: Self-pay | Admitting: Family Medicine

## 2017-12-16 DIAGNOSIS — M7651 Patellar tendinitis, right knee: Secondary | ICD-10-CM

## 2018-01-03 ENCOUNTER — Ambulatory Visit: Payer: BLUE CROSS/BLUE SHIELD | Admitting: Family Medicine

## 2018-01-11 ENCOUNTER — Other Ambulatory Visit: Payer: Self-pay | Admitting: Family Medicine

## 2018-01-11 DIAGNOSIS — I10 Essential (primary) hypertension: Secondary | ICD-10-CM

## 2018-01-27 ENCOUNTER — Ambulatory Visit: Payer: BLUE CROSS/BLUE SHIELD | Admitting: Family Medicine

## 2018-01-30 ENCOUNTER — Ambulatory Visit: Payer: BLUE CROSS/BLUE SHIELD | Admitting: Family Medicine

## 2018-02-02 ENCOUNTER — Other Ambulatory Visit: Payer: Self-pay | Admitting: Family Medicine

## 2018-02-02 DIAGNOSIS — I10 Essential (primary) hypertension: Secondary | ICD-10-CM

## 2018-02-15 ENCOUNTER — Other Ambulatory Visit: Payer: Self-pay | Admitting: Family Medicine

## 2018-02-15 DIAGNOSIS — K219 Gastro-esophageal reflux disease without esophagitis: Secondary | ICD-10-CM

## 2018-02-20 ENCOUNTER — Encounter: Payer: Self-pay | Admitting: Family Medicine

## 2018-02-20 ENCOUNTER — Ambulatory Visit: Payer: BLUE CROSS/BLUE SHIELD | Admitting: Family Medicine

## 2018-02-20 VITALS — BP 136/82 | HR 56 | Temp 98.5°F | Ht 75.0 in | Wt 276.2 lb

## 2018-02-20 DIAGNOSIS — Z23 Encounter for immunization: Secondary | ICD-10-CM | POA: Diagnosis not present

## 2018-02-20 DIAGNOSIS — E1169 Type 2 diabetes mellitus with other specified complication: Secondary | ICD-10-CM | POA: Diagnosis not present

## 2018-02-20 DIAGNOSIS — I10 Essential (primary) hypertension: Secondary | ICD-10-CM | POA: Diagnosis not present

## 2018-02-20 DIAGNOSIS — E669 Obesity, unspecified: Secondary | ICD-10-CM

## 2018-02-20 LAB — HEMOGLOBIN A1C: HEMOGLOBIN A1C: 6.5 % (ref 4.6–6.5)

## 2018-02-20 MED ORDER — ONETOUCH VERIO W/DEVICE KIT: PACK | 0 refills | Status: DC

## 2018-02-20 MED ORDER — GLUCOSE BLOOD VI STRP: ORAL_STRIP | 1 refills | Status: DC

## 2018-02-20 MED ORDER — METFORMIN HCL 500 MG PO TABS: ORAL_TABLET | ORAL | 1 refills | Status: DC

## 2018-02-20 NOTE — Patient Instructions (Signed)
Give us 2-3 business days to get the results of your labs back.   Keep the diet clean and stay active.  Let us know if you need anything. 

## 2018-02-20 NOTE — Progress Notes (Signed)
Pre visit review using our clinic review tool, if applicable. No additional management support is needed unless otherwise documented below in the visit note.in

## 2018-02-20 NOTE — Progress Notes (Signed)
Subjective:   Chief Complaint  Patient presents with  . Follow-up     Alex Gray is a 53 y.o. male here for follow-up of diabetes.   Alex Gray  Has not been checking sugars routinely.  Patient does not require insulin.   Medications include: Metformin  Exercise: walks on treadmill, stays active at work  Hypertension Patient presents for hypertension follow up. He does monitor home blood pressures. Blood pressures ranging on average from 110-120's/70-80's. He is compliant with medications- Norvasc 10 mg/d. Patient has these side effects of medication: none He is usually adhering to a healthy diet overall. Exercise: walking   Past Medical History:  Diagnosis Date  . Carpal tunnel syndrome   . Essential hypertension 01/18/2016  . Obesity 01/18/2016     Related testing: Date of retinal exam: Due Pneumovax: done Flu Shot: going to do today  Review of Systems: Pulmonary:  No SOB Cardiovascular:  No chest pain  Objective:  BP 136/82 (BP Location: Left Arm, Patient Position: Sitting, Cuff Size: Large)   Pulse (!) 56   Temp 98.5 F (36.9 C) (Oral)   Ht 6\' 3"  (1.905 m)   Wt 276 lb 4 oz (125.3 kg)   SpO2 95%   BMI 34.53 kg/m  General:  Well developed, well nourished, in no apparent distress Skin:  Warm, no pallor or diaphoresis Head:  Normocephalic, atraumatic Eyes:  Pupils equal and round, sclera anicteric without injection  Lungs:  CTAB, no access msc use Cardio:  RRR, no bruits, no LE edema Musculoskeletal:  Symmetrical muscle groups noted without atrophy or deformity Neuro:  Sensation intact to pinprick on feet Psych: Age appropriate judgment and insight  Assessment:   Diabetes mellitus type 2 in obese ([place]) - Plan: metFORMIN (GLUCOPHAGE) 500 MG tablet, glucose blood (ONETOUCH VERIO) test strip, Hemoglobin A1c  Essential hypertension   Plan:   Orders as above. Counseled on diet and exercise. Cont Metformin for now. I think his sugars will be quite a bit better  after his wt loss and starting the med.  Schedule eye exam. F/u in 3 mo. The patient voiced understanding and agreement to the plan.  [place], DO 02/20/18 3:42 PM

## 2018-02-26 ENCOUNTER — Encounter: Payer: Self-pay | Admitting: Family Medicine

## 2018-03-20 DIAGNOSIS — M25532 Pain in left wrist: Secondary | ICD-10-CM | POA: Diagnosis not present

## 2018-03-27 DIAGNOSIS — M25532 Pain in left wrist: Secondary | ICD-10-CM | POA: Diagnosis not present

## 2018-03-31 DIAGNOSIS — M25532 Pain in left wrist: Secondary | ICD-10-CM | POA: Diagnosis not present

## 2018-04-05 ENCOUNTER — Other Ambulatory Visit: Payer: Self-pay | Admitting: Family Medicine

## 2018-04-05 DIAGNOSIS — I10 Essential (primary) hypertension: Secondary | ICD-10-CM

## 2018-05-05 ENCOUNTER — Other Ambulatory Visit: Payer: Self-pay | Admitting: Family Medicine

## 2018-05-05 DIAGNOSIS — I10 Essential (primary) hypertension: Secondary | ICD-10-CM

## 2018-05-26 ENCOUNTER — Ambulatory Visit: Payer: BLUE CROSS/BLUE SHIELD | Admitting: Family Medicine

## 2018-05-29 ENCOUNTER — Other Ambulatory Visit: Payer: Self-pay | Admitting: Family Medicine

## 2018-05-29 DIAGNOSIS — I10 Essential (primary) hypertension: Secondary | ICD-10-CM

## 2018-06-11 ENCOUNTER — Other Ambulatory Visit: Payer: Self-pay | Admitting: Family Medicine

## 2018-06-11 DIAGNOSIS — I10 Essential (primary) hypertension: Secondary | ICD-10-CM

## 2018-07-09 ENCOUNTER — Ambulatory Visit: Payer: Self-pay | Admitting: Family Medicine

## 2018-08-13 ENCOUNTER — Ambulatory Visit: Payer: Self-pay | Admitting: Family Medicine

## 2018-08-15 ENCOUNTER — Other Ambulatory Visit: Payer: Self-pay | Admitting: Family Medicine

## 2018-08-15 DIAGNOSIS — E1169 Type 2 diabetes mellitus with other specified complication: Secondary | ICD-10-CM

## 2018-09-06 ENCOUNTER — Other Ambulatory Visit: Payer: Self-pay | Admitting: Family Medicine

## 2018-09-06 DIAGNOSIS — K219 Gastro-esophageal reflux disease without esophagitis: Secondary | ICD-10-CM

## 2018-10-05 ENCOUNTER — Other Ambulatory Visit: Payer: Self-pay | Admitting: Family Medicine

## 2018-10-05 DIAGNOSIS — I10 Essential (primary) hypertension: Secondary | ICD-10-CM

## 2018-12-12 ENCOUNTER — Ambulatory Visit: Payer: Self-pay | Admitting: Family Medicine

## 2018-12-19 ENCOUNTER — Other Ambulatory Visit: Payer: Self-pay

## 2018-12-22 ENCOUNTER — Ambulatory Visit: Payer: Managed Care, Other (non HMO) | Admitting: Family Medicine

## 2018-12-22 ENCOUNTER — Encounter: Payer: Self-pay | Admitting: Family Medicine

## 2018-12-22 ENCOUNTER — Other Ambulatory Visit: Payer: Self-pay

## 2018-12-22 VITALS — BP 128/84 | HR 101 | Temp 97.9°F | Ht 75.0 in | Wt 288.0 lb

## 2018-12-22 DIAGNOSIS — E669 Obesity, unspecified: Secondary | ICD-10-CM

## 2018-12-22 DIAGNOSIS — Z1211 Encounter for screening for malignant neoplasm of colon: Secondary | ICD-10-CM | POA: Diagnosis not present

## 2018-12-22 DIAGNOSIS — K219 Gastro-esophageal reflux disease without esophagitis: Secondary | ICD-10-CM

## 2018-12-22 DIAGNOSIS — I10 Essential (primary) hypertension: Secondary | ICD-10-CM

## 2018-12-22 DIAGNOSIS — Z0000 Encounter for general adult medical examination without abnormal findings: Secondary | ICD-10-CM | POA: Diagnosis not present

## 2018-12-22 DIAGNOSIS — E1169 Type 2 diabetes mellitus with other specified complication: Secondary | ICD-10-CM

## 2018-12-22 LAB — LIPID PANEL
Cholesterol: 153 mg/dL (ref 0–200)
HDL: 39.5 mg/dL (ref >39.00)
LDL Cholesterol: 89 mg/dL (ref 0–99)
NonHDL: 113.01
Total CHOL/HDL Ratio: 4
Triglycerides: 118 mg/dL (ref 0.0–149.0)
VLDL: 23.6 mg/dL (ref 0.0–40.0)

## 2018-12-22 LAB — CBC
HCT: 45.6 % (ref 39.0–52.0)
Hemoglobin: 14.9 g/dL (ref 13.0–17.0)
MCHC: 32.6 g/dL (ref 30.0–36.0)
MCV: 78.6 fl (ref 78.0–100.0)
Platelets: 255 10*3/uL (ref 150.0–400.0)
RBC: 5.8 Mil/uL (ref 4.22–5.81)
RDW: 14.4 % (ref 11.5–15.5)
WBC: 4.5 10*3/uL (ref 4.0–10.5)

## 2018-12-22 LAB — COMPREHENSIVE METABOLIC PANEL
ALT: 16 U/L (ref 0–53)
AST: 14 U/L (ref 0–37)
Albumin: 4.4 g/dL (ref 3.5–5.2)
Alkaline Phosphatase: 107 U/L (ref 39–117)
BUN: 14 mg/dL (ref 6–23)
CO2: 32 mEq/L (ref 19–32)
Calcium: 9.7 mg/dL (ref 8.4–10.5)
Chloride: 98 mEq/L (ref 96–112)
Creatinine, Ser: 1.03 mg/dL (ref 0.40–1.50)
GFR: 91.65 mL/min (ref >60.00)
Glucose, Bld: 316 mg/dL — ABNORMAL HIGH (ref 70–99)
Potassium: 4.3 mEq/L (ref 3.5–5.1)
Sodium: 137 mEq/L (ref 135–145)
Total Bilirubin: 0.7 mg/dL (ref 0.2–1.2)
Total Protein: 7.4 g/dL (ref 6.0–8.3)

## 2018-12-22 LAB — MICROALBUMIN / CREATININE URINE RATIO
Creatinine,U: 138.8 mg/dL
Microalb Creat Ratio: 1.9 mg/g (ref 0.0–30.0)
Microalb, Ur: 2.6 mg/dL — ABNORMAL HIGH (ref 0.0–1.9)

## 2018-12-22 LAB — HEMOGLOBIN A1C: Hgb A1c MFr Bld: 8.9 % — ABNORMAL HIGH (ref 4.6–6.5)

## 2018-12-22 MED ORDER — AMLODIPINE BESYLATE 10 MG PO TABS: 10.0000 mg | ORAL_TABLET | Freq: Every day | ORAL | 3 refills | Status: DC

## 2018-12-22 MED ORDER — METFORMIN HCL 500 MG PO TABS: ORAL_TABLET | ORAL | 2 refills | Status: DC

## 2018-12-22 MED ORDER — ATORVASTATIN CALCIUM 40 MG PO TABS: 40.0000 mg | ORAL_TABLET | Freq: Every day | ORAL | 3 refills | Status: DC

## 2018-12-22 MED ORDER — PANTOPRAZOLE SODIUM 40 MG PO TBEC: 40.0000 mg | DELAYED_RELEASE_TABLET | Freq: Every day | ORAL | 2 refills | Status: DC

## 2018-12-22 NOTE — Patient Instructions (Addendum)
Give [place] 2-3 business days to get the results of your labs back.   If you do not hear anything about your referral in the next 1-2 weeks, call our office and ask for an update.  Call your eye provider for an appointment.  The only lifestyle changes that have data behind them are weight loss for the overweight/obese and elevating the head of the bed. Finding out which foods/positions are triggers is important.  Add Pepcid (famotidine) 20 mg twice daily.   Keep the diet clean and stay active.  Aim to do some physical exertion for 150 minutes per week. This is typically divided into 5 days per week, 30 minutes per day. The activity should be enough to get your heart rate up. Anything is better than nothing if you have time constraints.  Healthy Eating Plan Many factors influence your heart health, including eating and exercise habits. Heart (coronary) risk increases with abnormal blood fat (lipid) levels. Heart-healthy meal planning includes limiting unhealthy fats, increasing healthy fats, and making other small dietary changes. This includes maintaining a healthy body weight to help keep lipid levels within a normal range.  WHAT IS MY PLAN?  Your health care provider recommends that you:  Drink a glass of water before meals to help with satiety.  Eat slowly.  An alternative to the water is to add Metamucil. This will help with satiety as well. It does contain calories, unlike water.  WHAT TYPES OF FAT SHOULD I CHOOSE?  Choose healthy fats more often. Choose monounsaturated and polyunsaturated fats, such as olive oil and canola oil, flaxseeds, walnuts, almonds, and seeds.  Eat more omega-3 fats. Good choices include salmon, mackerel, sardines, tuna, flaxseed oil, and ground flaxseeds. Aim to eat fish at least two times each week.  Avoid foods with partially hydrogenated oils in them. These contain trans fats. Examples of foods that contain trans fats are stick margarine, some tub  margarines, cookies, crackers, and other baked goods. If you are going to avoid a fat, this is the one to avoid!  WHAT GENERAL GUIDELINES DO I NEED TO FOLLOW?  Check food labels carefully to identify foods with trans fats. Avoid these types of options when possible.  Fill one half of your plate with vegetables and green salads. Eat 4-5 servings of vegetables per day. A serving of vegetables equals 1 cup of raw leafy vegetables,  cup of raw or cooked cut-up vegetables, or  cup of vegetable juice.  Fill one fourth of your plate with whole grains. Look for the word "whole" as the first word in the ingredient list.  Fill one fourth of your plate with lean protein foods.  Eat 4-5 servings of fruit per day. A serving of fruit equals one medium whole fruit,  cup of dried fruit,  cup of fresh, frozen, or canned fruit. Try to avoid fruits in cups/syrups as the sugar content can be high.  Eat more foods that contain soluble fiber. Examples of foods that contain this type of fiber are apples, broccoli, carrots, beans, peas, and barley. Aim to get 20-30 g of fiber per day.  Eat more home-cooked food and less restaurant, buffet, and fast food.  Limit or avoid alcohol.  Limit foods that are high in starch and sugar.  Avoid fried foods when able.  Cook foods by using methods other than frying. Baking, boiling, grilling, and broiling are all great options. Other fat-reducing suggestions include: ? Removing the skin from poultry. ? Removing all visible fats  from meats. ? Skimming the fat off of stews, soups, and gravies before serving them. ? Steaming vegetables in water or broth.  Lose weight if you are overweight. Losing just 5-10% of your initial body weight can help your overall health and prevent diseases such as diabetes and heart disease.  Increase your consumption of nuts, legumes, and seeds to 4-5 servings per week. One serving of dried beans or legumes equals  cup after being cooked,  one serving of nuts equals 1 ounces, and one serving of seeds equals  ounce or 1 tablespoon.  WHAT ARE GOOD FOODS CAN I EAT? Grains Grainy breads (try to find bread that is 3 g of fiber per slice or greater), oatmeal, light popcorn. Whole-grain cereals. Rice and pasta, including brown rice and those that are made with whole wheat. Edamame pasta is a great alternative to grain pasta. It has a higher protein content. Try to avoid significant consumption of white bread, sugary cereals, or pastries/baked goods.  Vegetables All vegetables. Cooked white potatoes do not count as vegetables.  Fruits All fruits, but limit pineapple and bananas as these fruits have a higher sugar content.  Meats and Other Protein Sources Lean, well-trimmed beef, veal, pork, and lamb. Chicken and [place] without skin. All fish and shellfish. Wild duck, rabbit, pheasant, and venison. Egg whites or low-cholesterol egg substitutes. Dried beans, peas, lentils, and tofu.Seeds and most nuts.  Dairy Low-fat or nonfat cheeses, including ricotta, string, and mozzarella. Skim or 1% milk that is liquid, powdered, or evaporated. Buttermilk that is made with low-fat milk. Nonfat or low-fat yogurt. Soy/Almond milk are good alternatives if you cannot handle dairy.  Beverages Water is the best for you. Sports drinks with less sugar are more desirable unless you are a highly active athlete.  Sweets and Desserts Sherbets and fruit ices. Honey, jam, marmalade, jelly, and syrups. Dark chocolate.  Eat all sweets and desserts in moderation.  Fats and Oils Nonhydrogenated (trans-free) margarines. Vegetable oils, including soybean, sesame, sunflower, olive, peanut, safflower, corn, canola, and cottonseed. Salad dressings or mayonnaise that are made with a vegetable oil. Limit added fats and oils that you use for cooking, baking, salads, and as spreads.  Other Cocoa powder. Coffee and tea. Most condiments.  The items listed above may  not be a complete list of recommended foods or beverages. Contact your dietitian for more options.

## 2018-12-22 NOTE — Progress Notes (Signed)
Chief Complaint  Patient presents with  . Follow-up    Well Male Alex Gray is here for a complete physical.   His last physical was >1 year ago.  Current diet: in general, a "healthy" diet.  Current exercise: walking, wt resistance Weight trend: Has gaine da little Daytime fatigue? No. Seat belt? Yes.    Health maintenance Colonoscopy- No Tetanus- Yes HIV- Yes Due for eye exam   Past Medical History:  Diagnosis Date  . Carpal tunnel syndrome   . Essential hypertension 01/18/2016  . Obesity 01/18/2016      Past Surgical History:  Procedure Laterality Date  . KNEE ARTHROSCOPY      Medications  Current Outpatient Medications on File Prior to Visit  Medication Sig Dispense Refill  . Blood Glucose Monitoring Suppl (ONETOUCH VERIO) w/Device KIT Use once daily to check blood sugar 1 kit 0  . glucose blood (ONETOUCH VERIO) test strip Use once daily to check blood sugar.  E11.9 100 each 1  . ONE TOUCH LANCETS MISC Use once daily to check blood sugar.  E11.9 200 each 1    Allergies No Known Allergies  Family History Family History  Problem Relation Age of Onset  . Diabetes Mother   . Hypertension Father   . Diabetes Father     Review of Systems: Constitutional:  no fevers Eye:  no recent significant change in vision Ear/Nose/Mouth/Throat:  Ears:  no hearing loss Nose/Mouth/Throat:  no complaints of nasal congestion, no sore throat Cardiovascular:  no chest pain, no palpitations Respiratory:  no cough and no shortness of breath; +episodes of hypopnea  Gastrointestinal: +reflux; otherwise no abdominal pain, no change in bowel habits GU:  Male: negative for dysuria, frequency, and incontinence and negative for prostate symptoms Musculoskeletal/Extremities:  no pain, redness, or swelling of the joints Integumentary (Skin/Breast):  no abnormal skin lesions reported Neurologic:  no headaches Endocrine: No unexpected weight changes Hematologic/Lymphatic:  no abnormal  bleeding  Exam BP 128/84 (BP Location: Left Arm, Patient Position: Sitting, Cuff Size: Large)   Pulse (!) 101   Temp 97.9 F (36.6 C) (Temporal)   Ht 6' 3"  (1.905 m)   Wt 288 lb (130.6 kg)   SpO2 94%   BMI 36.00 kg/m  General:  well developed, well nourished, in no apparent distress Skin:  no significant moles, warts, or growths Head:  no masses, lesions, or tenderness Eyes:  pupils equal and round, sclera anicteric without injection Ears:  canals without lesions, TMs shiny without retraction, no obvious effusion, no erythema Nose:  nares patent, septum midline, mucosa normal Throat/Pharynx:  lips and gingiva without lesion; tongue and uvula midline; non-inflamed pharynx; no exudates or postnasal drainage Neck: neck supple without adenopathy, thyromegaly, or masses Lungs:  clear to auscultation, breath sounds equal bilaterally, no respiratory distress Rectal: Deferred Musculoskeletal:  symmetrical muscle groups noted without atrophy or deformity Extremities:  no clubbing, cyanosis, or edema, no deformities, no skin discoloration Neuro:  gait normal; deep tendon reflexes normal and symmetric Psych: well oriented with normal range of affect and appropriate judgment/insight  Assessment and Plan  Well adult exam - Plan: CBC, Comprehensive metabolic panel, Lipid panel, Hemoglobin A1c, Microalbumin / creatinine urine ratio  Diabetes mellitus type 2 in obese (HCC) - Plan: atorvastatin (LIPITOR) 40 MG tablet, metFORMIN (GLUCOPHAGE) 500 MG tablet  Gastroesophageal reflux disease, unspecified whether esophagitis present  Screen for colon cancer - Plan: Ambulatory referral to Gastroenterology  Essential hypertension  Gastroesophageal reflux disease - Plan: pantoprazole (PROTONIX) 40  MG tablet   Well 53 y.o. male. Counseled on diet and exercise. Counseled on risks and benefits of prostate cancer screening with PSA. The patient agrees to forego testing. Add Pepcid. Cont meds.   Immunizations, labs, and further orders as above. Follow up in 6 mo pending above. The patient voiced understanding and agreement to the plan.  [place], DO 12/22/18 9:57 AM

## 2018-12-23 ENCOUNTER — Other Ambulatory Visit: Payer: Self-pay | Admitting: Family Medicine

## 2018-12-23 MED ORDER — DAPAGLIFLOZIN PROPANEDIOL 10 MG PO TABS: 10.0000 mg | ORAL_TABLET | Freq: Every day | ORAL | 3 refills | Status: DC

## 2018-12-25 ENCOUNTER — Encounter: Payer: Self-pay | Admitting: Family Medicine

## 2018-12-29 ENCOUNTER — Other Ambulatory Visit: Payer: Self-pay | Admitting: Family Medicine

## 2018-12-29 DIAGNOSIS — I10 Essential (primary) hypertension: Secondary | ICD-10-CM

## 2019-01-05 ENCOUNTER — Encounter: Payer: Self-pay | Admitting: Family Medicine

## 2019-01-05 ENCOUNTER — Other Ambulatory Visit: Payer: Self-pay | Admitting: Family Medicine

## 2019-01-05 DIAGNOSIS — K30 Functional dyspepsia: Secondary | ICD-10-CM

## 2019-01-22 ENCOUNTER — Encounter: Payer: Self-pay | Admitting: Family Medicine

## 2019-01-28 ENCOUNTER — Encounter: Payer: Self-pay | Admitting: Family Medicine

## 2019-01-29 MED ORDER — DAPAGLIFLOZIN PROPANEDIOL 10 MG PO TABS: 10.0000 mg | ORAL_TABLET | Freq: Every day | ORAL | 3 refills | Status: DC

## 2019-03-10 ENCOUNTER — Encounter: Payer: Self-pay | Admitting: Family Medicine

## 2019-04-05 ENCOUNTER — Encounter: Payer: Self-pay | Admitting: Family Medicine

## 2019-04-08 ENCOUNTER — Emergency Department (HOSPITAL_BASED_OUTPATIENT_CLINIC_OR_DEPARTMENT_OTHER)
Admission: EM | Admit: 2019-04-08 | Discharge: 2019-04-08 | Disposition: A | Discharge status: Home / Self Care | Payer: Managed Care, Other (non HMO) | Attending: Emergency Medicine | Admitting: Emergency Medicine

## 2019-04-08 ENCOUNTER — Other Ambulatory Visit: Payer: Self-pay

## 2019-04-08 ENCOUNTER — Emergency Department (HOSPITAL_BASED_OUTPATIENT_CLINIC_OR_DEPARTMENT_OTHER): Payer: Managed Care, Other (non HMO)

## 2019-04-08 ENCOUNTER — Encounter (HOSPITAL_BASED_OUTPATIENT_CLINIC_OR_DEPARTMENT_OTHER): Payer: Self-pay

## 2019-04-08 DIAGNOSIS — Z7984 Long term (current) use of oral hypoglycemic drugs: Secondary | ICD-10-CM | POA: Diagnosis not present

## 2019-04-08 DIAGNOSIS — E782 Mixed hyperlipidemia: Secondary | ICD-10-CM | POA: Insufficient documentation

## 2019-04-08 DIAGNOSIS — Z79899 Other long term (current) drug therapy: Secondary | ICD-10-CM | POA: Diagnosis not present

## 2019-04-08 DIAGNOSIS — I10 Essential (primary) hypertension: Secondary | ICD-10-CM | POA: Insufficient documentation

## 2019-04-08 DIAGNOSIS — K219 Gastro-esophageal reflux disease without esophagitis: Secondary | ICD-10-CM | POA: Diagnosis not present

## 2019-04-08 DIAGNOSIS — R079 Chest pain, unspecified: Secondary | ICD-10-CM

## 2019-04-08 DIAGNOSIS — E119 Type 2 diabetes mellitus without complications: Secondary | ICD-10-CM | POA: Insufficient documentation

## 2019-04-08 DIAGNOSIS — R0789 Other chest pain: Secondary | ICD-10-CM | POA: Diagnosis present

## 2019-04-08 HISTORY — DX: Type 2 diabetes mellitus without complications: E11.9

## 2019-04-08 HISTORY — DX: Pure hypercholesterolemia, unspecified: E78.00

## 2019-04-08 LAB — BASIC METABOLIC PANEL
Anion gap: 8 (ref 5–15)
BUN: 13 mg/dL (ref 6–20)
CO2: 26 mmol/L (ref 22–32)
Calcium: 9.4 mg/dL (ref 8.9–10.3)
Chloride: 104 mmol/L (ref 98–111)
Creatinine, Ser: 1.15 mg/dL (ref 0.61–1.24)
GFR calc Af Amer: >60 mL/min (ref >60)
GFR calc non Af Amer: >60 mL/min (ref >60)
Glucose, Bld: 98 mg/dL (ref 70–99)
Potassium: 3.8 mmol/L (ref 3.5–5.1)
Sodium: 138 mmol/L (ref 135–145)

## 2019-04-08 LAB — CBC
HCT: 49.9 % (ref 39.0–52.0)
Hemoglobin: 15.3 g/dL (ref 13.0–17.0)
MCH: 25.4 pg — ABNORMAL LOW (ref 26.0–34.0)
MCHC: 30.7 g/dL (ref 30.0–36.0)
MCV: 82.8 fL (ref 80.0–100.0)
Platelets: 239 10*3/uL (ref 150–400)
RBC: 6.03 MIL/uL — ABNORMAL HIGH (ref 4.22–5.81)
RDW: 14.5 % (ref 11.5–15.5)
WBC: 5.7 10*3/uL (ref 4.0–10.5)
nRBC: 0 % (ref 0.0–0.2)

## 2019-04-08 LAB — TROPONIN I (HIGH SENSITIVITY): Troponin I (High Sensitivity): 2 ng/L (ref <18)

## 2019-04-08 MED ORDER — SODIUM CHLORIDE 0.9% FLUSH
3.0000 mL | Freq: Once | INTRAVENOUS | Status: DC
  Filled 2019-04-08: qty 3

## 2019-04-08 NOTE — ED Notes (Signed)
54yo AA male presents with mid sternal "indigestion" type pain, non-radiating mid chest pain, denies N/V or SOB, states this pain, or discomfort has been occurring on and off for the past month, is worse after a heavy meal, belching relieves pain, but felt that it needed to be evaluated

## 2019-04-08 NOTE — ED Triage Notes (Addendum)
Pt c/o "chest discomfort for a couple of month"-denies fever/flu sx-NAD-steady gait

## 2019-04-08 NOTE — Discharge Instructions (Addendum)
Please follow-up with a cardiologist in the next 1 to 2 weeks.  As I explained, although your chest pain work-up was negative in the ER, you do have multiple risk factors for heart disease.  It is very important that you follow-up in the next week or 2 with a cardiologist.  If you experience any new or worsening chest pain, or any other concerning symptoms, you should return to the emergency department immediately.

## 2019-04-08 NOTE — ED Provider Notes (Signed)
[facility] EMERGENCY DEPARTMENT Provider Note   CSN: [id number] Arrival date & time: 04/08/19  1714     History Chief Complaint  Patient presents with  . Chest Pain    Alex Gray is a 54 y.o. male presenting to the emergency department with chest pain.  He has a history of high cholesterol, hypertension, type 2 diabetes.  Reports he has been having issues that feels like reflux for many many months.  He says every time after he eats he feels like he needs to belch and has a burning sensation in his epigastrium.  There is been no change in this pattern.  However he decided that he should come to the emergency department to "get checked out" today.  He denies any history of angina.  He has never had a cardiac evaluation or seen a cardiologist.  He denies any smoking history.  He reports a family history of an MI in his uncle at unclear age.  No other sig family cardiac hx.   Other than his typical epigastric discomfort, he denies any other symptoms including nausea, vomiting, lightheadedness, diaphoresis.  His pain does not radiate.  He currently does not have any symptoms.  He last experienced this earlier today when he was eating.  He normally takes Pepcid for symptoms  HPI     Past Medical History:  Diagnosis Date  . Carpal tunnel syndrome   . Diabetes mellitus without complication ([place])   . Essential hypertension 01/18/2016  . High cholesterol   . Obesity 01/18/2016    Patient Active Problem List   Diagnosis Date Noted  . Diabetes mellitus type 2 in obese ([place]) 09/26/2017  . Gastroesophageal reflux disease 04/10/2017  . Essential hypertension 01/18/2016  . Obesity 01/18/2016    Past Surgical History:  Procedure Laterality Date  . KNEE ARTHROSCOPY         Family History  Problem Relation Age of Onset  . Diabetes Mother   . Hypertension Father   . Diabetes Father     Social History   Tobacco Use  . Smoking status: Never Smoker  . Smokeless tobacco:  Never Used  Substance Use Topics  . Alcohol use: No  . Drug use: No    Home Medications Prior to Admission medications   Medication Sig Start Date End Date Taking? Authorizing Provider  amLODipine (NORVASC) 10 MG tablet Take 1 tablet (10 mg total) by mouth daily. 12/22/18   Shelda Pal, DO  atorvastatin (LIPITOR) 40 MG tablet Take 1 tablet (40 mg total) by mouth daily. 12/22/18   Shelda Pal, DO  Blood Glucose Monitoring Suppl [facility] VERIO) w/Device KIT Use once daily to check blood sugar 02/20/18   Wendling, Crosby Oyster, DO  dapagliflozin propanediol (FARXIGA) 10 MG TABS tablet Take 10 mg by mouth daily before breakfast. 01/29/19   Wendling, Crosby Oyster, DO  glucose blood (ONETOUCH VERIO) test strip Use once daily to check blood sugar.  E11.9 02/20/18   Shelda Pal, DO  metFORMIN (GLUCOPHAGE) 500 MG tablet Take 2 tabs twice daily. 12/22/18   Shelda Pal, DO  ONE TOUCH LANCETS MISC Use once daily to check blood sugar.  E11.9 09/26/17   Shelda Pal, DO  pantoprazole (PROTONIX) 40 MG tablet Take 1 tablet (40 mg total) by mouth daily. 12/22/18   Shelda Pal, DO    Allergies    Patient has no known allergies.  Review of Systems   Review of Systems  Constitutional: Negative  for chills and fever.  Respiratory: Negative for cough and shortness of breath.   Cardiovascular: Positive for chest pain. Negative for palpitations and leg swelling.  Gastrointestinal: Positive for abdominal pain. Negative for vomiting.  Neurological: Negative for syncope and light-headedness.  All other systems reviewed and are negative.   Physical Exam Updated Vital Signs BP (!) 139/91   Pulse 72   Temp 97.9 F (36.6 C) (Oral)   Resp (!) 22   Ht 6' 3"  (1.905 m)   Wt 130.6 kg   SpO2 97%   BMI 36.00 kg/m   Physical Exam Vitals and nursing note reviewed.  Constitutional:      Appearance: He is well-developed.  HENT:     Head:  Normocephalic and atraumatic.  Eyes:     Conjunctiva/sclera: Conjunctivae normal.  Cardiovascular:     Rate and Rhythm: Normal rate and regular rhythm.     Heart sounds: No murmur.  Pulmonary:     Effort: Pulmonary effort is normal. No respiratory distress.     Breath sounds: Normal breath sounds.  Abdominal:     Palpations: Abdomen is soft.     Tenderness: There is abdominal tenderness (mild) in the epigastric area. There is no guarding. Negative signs include Murphy's sign.  Musculoskeletal:     Cervical back: Neck supple.  Skin:    General: Skin is warm and dry.  Neurological:     Mental Status: He is alert.  Psychiatric:        Mood and Affect: Mood normal.        Behavior: Behavior normal.     ED Results / Procedures / Treatments   Labs (all labs ordered are listed, but only abnormal results are displayed) Labs Reviewed  CBC - Abnormal; Notable for the following components:      Result Value   RBC 6.03 (*)    MCH 25.4 (*)    All other components within normal limits  BASIC METABOLIC PANEL  TROPONIN I (HIGH SENSITIVITY)    EKG None  Radiology DG Chest 2 View  Result Date: 04/08/2019 CLINICAL DATA:  Chest pain EXAM: CHEST - 2 VIEW COMPARISON:  12/01/2015 FINDINGS: Probable scarring at the bases. No acute consolidation or effusion. Stable cardiomediastinal silhouette. No pneumothorax. IMPRESSION: No active cardiopulmonary disease.  Probable scarring at the bases Electronically Signed   By: Donavan Foil M.D.   On: 04/08/2019 17:55    Procedures Procedures (including critical care time)  Medications Ordered in ED Medications - No data to display  ED Course  I have reviewed the triage vital signs and the nursing notes.  Pertinent labs & imaging results that were available during my care of the patient were reviewed by me and considered in my medical decision making (see chart for details).  54 yo male w/ hx of daily reflux presenting to ED with epigastric  discomfort.  There has been no change in his daily symptom pattern, but he decided to come get checked out today to ensure "everything is okay with my heart."  He has had no prior cardiac evaluation as an outpatient.   He has a benign physical exam other than being overweight.  Some mild epigastric ttp, but no focal findings suggestive of pancreatitis or cholecystitis.  Workup demonstrates negative troponin, unremarkable BMP and CBC.  No PTX or PNA on xray.  Low suspicion for PE or aortic dissection.  ECG benign.    He has a HEART score of 3.  I suspect his pain  is more likely reflux related than ACS - it is nonexertional, associated with eating, and similar to reflux pain he has had in the past (relieved with pepcid).  However, based on his risk factors, I still recommended that that he f/u with a cardiologist in the next 2 weeks if possible for a screening evaluation.  He has never had a cardiac w/u before.  He verbalized understanding and agreement with this plan.    Final Clinical Impression(s) / ED Diagnoses Final diagnoses:  Gastroesophageal reflux disease, unspecified whether esophagitis present  Chest pain, unspecified type    Rx / DC Orders ED Discharge Orders    None       Wyvonnia Dusky, MD 04/09/19 1226

## 2019-04-27 ENCOUNTER — Ambulatory Visit: Payer: Managed Care, Other (non HMO) | Admitting: Family Medicine

## 2019-06-01 ENCOUNTER — Telehealth: Payer: Self-pay | Admitting: Family Medicine

## 2019-06-01 ENCOUNTER — Telehealth: Payer: Self-pay

## 2019-06-01 NOTE — Telephone Encounter (Signed)
PA form received from [facility]- form completed and faxed back to [phone]. Awaiting determination.

## 2019-06-01 NOTE — Telephone Encounter (Signed)
error 

## 2019-06-08 NOTE — Telephone Encounter (Signed)
Have not received response. Form refaxed.

## 2019-06-09 NOTE — Telephone Encounter (Signed)
Called [employer]- PA approved. They are re-faxing the approval information.

## 2019-06-23 ENCOUNTER — Ambulatory Visit: Payer: Managed Care, Other (non HMO) | Admitting: Family Medicine

## 2019-06-23 ENCOUNTER — Telehealth: Payer: Self-pay | Admitting: Family Medicine

## 2019-06-23 ENCOUNTER — Encounter: Payer: Self-pay | Admitting: Family Medicine

## 2019-06-23 ENCOUNTER — Other Ambulatory Visit: Payer: Self-pay

## 2019-06-23 VITALS — BP 132/82 | HR 81 | Temp 95.7°F | Ht 75.0 in | Wt 279.0 lb

## 2019-06-23 DIAGNOSIS — E1169 Type 2 diabetes mellitus with other specified complication: Secondary | ICD-10-CM

## 2019-06-23 DIAGNOSIS — I10 Essential (primary) hypertension: Secondary | ICD-10-CM | POA: Diagnosis not present

## 2019-06-23 DIAGNOSIS — E669 Obesity, unspecified: Secondary | ICD-10-CM | POA: Diagnosis not present

## 2019-06-23 DIAGNOSIS — M25561 Pain in right knee: Secondary | ICD-10-CM | POA: Diagnosis not present

## 2019-06-23 LAB — COMPREHENSIVE METABOLIC PANEL
ALT: 15 U/L (ref 0–53)
AST: 18 U/L (ref 0–37)
Albumin: 4.3 g/dL (ref 3.5–5.2)
Alkaline Phosphatase: 76 U/L (ref 39–117)
BUN: 15 mg/dL (ref 6–23)
CO2: 30 mEq/L (ref 19–32)
Calcium: 9.4 mg/dL (ref 8.4–10.5)
Chloride: 103 mEq/L (ref 96–112)
Creatinine, Ser: 0.97 mg/dL (ref 0.40–1.50)
GFR: 98.04 mL/min (ref >60.00)
Glucose, Bld: 147 mg/dL — ABNORMAL HIGH (ref 70–99)
Potassium: 4.1 mEq/L (ref 3.5–5.1)
Sodium: 139 mEq/L (ref 135–145)
Total Bilirubin: 0.7 mg/dL (ref 0.2–1.2)
Total Protein: 7 g/dL (ref 6.0–8.3)

## 2019-06-23 LAB — HEMOGLOBIN A1C: Hgb A1c MFr Bld: 6.5 % (ref 4.6–6.5)

## 2019-06-23 MED ORDER — DAPAGLIFLOZIN PROPANEDIOL 10 MG PO TABS: 10.0000 mg | ORAL_TABLET | Freq: Every day | ORAL | 2 refills | Status: DC

## 2019-06-23 NOTE — Telephone Encounter (Signed)
Pt dropped off copy of Covid Vaccination to be put on pt's chart. Document to be put at front office tray under providers name.

## 2019-06-23 NOTE — Patient Instructions (Addendum)
Give [place] 2-3 business days to get the results of your labs back.   Keep the diet clean and stay active.  Ice/cold pack over area for 10-15 min twice daily.  Heat (pad or rice pillow in microwave) over affected area, 10-15 minutes twice daily.   Schedule your eye exam.   Send [place] a message with your vaccination card.   Stretching and range of motion exercises These exercises warm up your muscles and joints and improve the movement and flexibility of your knee. These exercises also help to relieve pain and stiffness.   Exercise A: Knee flexion, active 1. Lie on your back with both knees straight. If this causes back discomfort, bend your uninjured knee so your foot is flat on the floor. 2. Slowly slide your left / right heel back toward your buttocks until you feel a gentle stretch in the front of your knee or thigh. Stop if you have pain. 3. Hold for3 seconds. 4. Slowly slide your left / right heel back to the starting position. 10 total repetitions. Repeat 2 times. Complete this exercise 3 times a week.  Exercise B: Knee extension, sitting 1. Sit with your left / right heel propped on a chair, a coffee table, or a footstool. Do not have anything under your knee to support it. 2. Allow your leg muscles to relax, letting gravity straighten out your knee. You should feel a stretch behind your left / right knee. 3. If told by your health care provide just above your kneecap. 4. Hold this position for 3 seconds. 5. Repeat for a total of 10 repetitions. Repeat 2 times. Complete this stretch 3 times a week.  Strengthening exercises These exercises build strength and endurance in your knee. Endurance is the ability to use your muscles for a long time, even after they get tired.  Exercise C: Quadriceps, isometric 1. Lie on your back with your left / right leg extended and your other knee bent. Put a rolled towel or small pillow under your right/left knee if told by your health care  provider. 2. Slowly tense the muscles in the front of your left / right thigh by pushing the back of your knee down. You should see your knee cap slide up toward your hip or see increased dimpling just above the knee. 3. For 3 seconds, keep the muscle as tight as you can without increasing your pain. 4. Relax the muscles slowly and completely. Repeat for 10 total repetitions. Repeat 2 times. Complete this exercise 3 times a week. Exercise D: Straight leg raises (quadriceps) 1. Lie on your back with your left / right leg extended and your other knee bent. 2. Tense the muscles in the front of your left / right thigh. You should see your kneecap slide up or see increased dimpling just above the knee. 3. Keep these muscles tight as you raise your leg 4-6 inches (10-15 cm) off the floor. 4. Hold this position for 3 seconds. 5. Keep these muscles tense as you lower your leg. 6. Relax the muscles slowly and completely. Repeat for a total of 10 repetitions. Repeat 2 times. Complete this exercise 3 times a week.  Exercise E: Hamstring curls 1. On the floor or a bed, lie on your abdomen with your legs straight. Put a folded towel or small pillow under your left / right thigh, just above your kneecap. 2. Slowly bend your left / right knee as far as you can without pain. Keep your hips flat  against the floor or bed. 3. Hold this position for 3 seconds. 4. Slowly lower your leg to the starting position. Repeat for a total of 10 repetitions. Repeat 2 times. Complete this exercise 3 times per week.  Stretching exercises These exercises warm up your muscles and joints and improve the movement and flexibility of your knee. These exercises also help to relieve pain and stiffness.  Exercise A: Quadriceps, prone 1. Lie on your abdomen on a firm surface, such as a bed or padded floor. 2. Bend your left / right knee and hold your ankle. If you cannot reach your ankle or pant leg, loop a belt around your foot and  grab the belt instead. 3. Gently pull your heel toward your buttocks. Your knee should not slide out to the side. You should feel a stretch in the front of your thigh and knee. 4. Hold this position for 30 seconds. Repeat 2 times. Complete this stretch 3 times a week.  Exercise B: Hamstring, doorway 1. Lie on your back in front of a doorway with your left / right leg resting against the wall and your other leg flat on the floor in the doorway. There should be a slight bend in your left / right knee. 2. Straighten your left / right knee. You should feel a stretch behind your knee or thigh. If you do not feel that stretch, scoot your buttocks closer to the door. 3. Hold this position for 30 seconds. Repeat 2 times. Complete this stretch 3 times a week.  Strengthening exercises These exercises build strength and endurance in your knee and leg muscles. Endurance is the ability to use your muscles for a long time, even after they get tired.   Exercise D: Wall slides (quadriceps) 1. Lean your back against a smooth wall or door, and walk your feet out 18-24 inches (45-61 cm) from it. 2. Place your feet hip-width apart. 3. Slowly slide down the wall or door until your knees bend 90 degrees. Keep your knees over your heels, not over your toes. Keep your knees in line with your hips. 4. Hold for 2 seconds. 5. Stand up to rest for 60 seconds. Repeat 2 times. Complete this exercise 3 times a week.  Exercise E: Bridge (hip extensors) 1. Lie on your back on a firm surface with your knees bent and your feet flat on the floor. 2. Tighten your buttocks muscles and lift your bottom off the floor until your trunk is level with your thighs. ? Do not arch your back. ? You should feel the muscles working in your buttocks and the back of your thighs. 3. Hold this position for 2 seconds. 4. Slowly lower your hips to the starting position. 5. Let your buttocks muscles relax completely between  repetitions. Repeat 2 times. Complete this exercise 3 times a week.

## 2019-06-23 NOTE — Telephone Encounter (Signed)
Information received and updated immunizations in chart.

## 2019-06-23 NOTE — Progress Notes (Signed)
Subjective:   Chief Complaint  Patient presents with  . Follow-up    Alex Gray is a 54 y.o. male here for follow-up of diabetes.   Alex Gray does not monitor his home sugars routinely.  Patient does not require insulin.   Medications include: Farxiga 10 mg/d, Metformin 1000 mg bid Diet is fair.  Exercise: walking, running  R knee pain 2 mo of pain, no inj, had been exercising more. Feels unsteady, sometimes will swell. Thinks it is catching/locking at times. Sometimes will not be able to extend fully. No redness. Hx of meniscal inj on L side years ago.   Past Medical History:  Diagnosis Date  . Carpal tunnel syndrome   . Diabetes mellitus without complication ([place])   . Essential hypertension 01/18/2016  . High cholesterol   . Obesity 01/18/2016     Related testing: Date of retinal exam: Due Pneumovax: done  Objective:  BP 132/82 (BP Location: Left Arm, Patient Position: Sitting, Cuff Size: Large)   Pulse 81   Temp (!) 95.7 F (35.4 C) (Temporal)   Ht 6\' 3"  (1.905 m)   Wt 279 lb (126.6 kg)   SpO2 94%   BMI 34.87 kg/m  General:  Well developed, well nourished, in no apparent distress Skin:  Warm, no pallor or diaphoresis Head:  Normocephalic, atraumatic Eyes:  Pupils equal and round, sclera anicteric without injection  Lungs:  CTAB, no access msc use Cardio:  RRR, no bruits, no LE edema Musculoskeletal:  Symmetrical muscle groups noted without atrophy or deformity Neuro:  Sensation intact to pinprick on feet Psych: Age appropriate judgment and insight  Assessment:   Diabetes mellitus type 2 in obese ([place]) - Plan: Hemoglobin A1c, Comprehensive metabolic panel  Essential hypertension  Right knee pain, unspecified chronicity   Plan:   1- Cont meds. Ck above. Counseled on diet/exercise. 2- Chronic, stable. Cont care. 3- Stretches/exercises. Heat, ice, Tylenol. Has appt w specialist in 3 d.  F/u in 3-6 mo pending labs.  The patient voiced understanding and  agreement to the plan.  [place], DO 06/23/19 9:59 AM

## 2019-09-25 ENCOUNTER — Other Ambulatory Visit: Payer: Self-pay | Admitting: Family Medicine

## 2019-09-25 DIAGNOSIS — I10 Essential (primary) hypertension: Secondary | ICD-10-CM

## 2019-11-18 ENCOUNTER — Ambulatory Visit: Payer: Managed Care, Other (non HMO) | Admitting: Family Medicine

## 2019-12-25 ENCOUNTER — Other Ambulatory Visit: Payer: Self-pay | Admitting: Family Medicine

## 2019-12-25 ENCOUNTER — Encounter: Payer: Managed Care, Other (non HMO) | Admitting: Family Medicine

## 2019-12-25 DIAGNOSIS — I10 Essential (primary) hypertension: Secondary | ICD-10-CM

## 2020-01-26 ENCOUNTER — Ambulatory Visit (INDEPENDENT_AMBULATORY_CARE_PROVIDER_SITE_OTHER): Payer: Managed Care, Other (non HMO) | Admitting: Family Medicine

## 2020-01-26 ENCOUNTER — Other Ambulatory Visit: Payer: Self-pay

## 2020-01-26 ENCOUNTER — Encounter: Payer: Self-pay | Admitting: Family Medicine

## 2020-01-26 ENCOUNTER — Other Ambulatory Visit: Payer: Self-pay | Admitting: Family Medicine

## 2020-01-26 VITALS — BP 130/78 | HR 74 | Temp 98.4°F | Ht 75.0 in | Wt 276.0 lb

## 2020-01-26 DIAGNOSIS — Z125 Encounter for screening for malignant neoplasm of prostate: Secondary | ICD-10-CM

## 2020-01-26 DIAGNOSIS — Z23 Encounter for immunization: Secondary | ICD-10-CM | POA: Diagnosis not present

## 2020-01-26 DIAGNOSIS — Z1159 Encounter for screening for other viral diseases: Secondary | ICD-10-CM

## 2020-01-26 DIAGNOSIS — E669 Obesity, unspecified: Secondary | ICD-10-CM

## 2020-01-26 DIAGNOSIS — E1169 Type 2 diabetes mellitus with other specified complication: Secondary | ICD-10-CM

## 2020-01-26 DIAGNOSIS — Z0000 Encounter for general adult medical examination without abnormal findings: Secondary | ICD-10-CM

## 2020-01-26 DIAGNOSIS — Z1211 Encounter for screening for malignant neoplasm of colon: Secondary | ICD-10-CM

## 2020-01-26 LAB — COMPREHENSIVE METABOLIC PANEL
ALT: 16 U/L (ref 0–53)
AST: 17 U/L (ref 0–37)
Albumin: 4.1 g/dL (ref 3.5–5.2)
Alkaline Phosphatase: 79 U/L (ref 39–117)
BUN: 15 mg/dL (ref 6–23)
CO2: 30 mEq/L (ref 19–32)
Calcium: 9.4 mg/dL (ref 8.4–10.5)
Chloride: 102 mEq/L (ref 96–112)
Creatinine, Ser: 0.96 mg/dL (ref 0.40–1.50)
GFR: 90.31 mL/min (ref >60.00)
Glucose, Bld: 162 mg/dL — ABNORMAL HIGH (ref 70–99)
Potassium: 3.9 mEq/L (ref 3.5–5.1)
Sodium: 139 mEq/L (ref 135–145)
Total Bilirubin: 0.8 mg/dL (ref 0.2–1.2)
Total Protein: 6.9 g/dL (ref 6.0–8.3)

## 2020-01-26 LAB — LIPID PANEL
Cholesterol: 154 mg/dL (ref 0–200)
HDL: 45.7 mg/dL (ref >39.00)
LDL Cholesterol: 87 mg/dL (ref 0–99)
NonHDL: 108.63
Total CHOL/HDL Ratio: 3
Triglycerides: 107 mg/dL (ref 0.0–149.0)
VLDL: 21.4 mg/dL (ref 0.0–40.0)

## 2020-01-26 LAB — PSA: PSA: 0.64 ng/mL (ref 0.10–4.00)

## 2020-01-26 LAB — MICROALBUMIN / CREATININE URINE RATIO
Creatinine,U: 61.3 mg/dL
Microalb Creat Ratio: 1.2 mg/g (ref 0.0–30.0)
Microalb, Ur: 0.7 mg/dL (ref 0.0–1.9)

## 2020-01-26 LAB — HEMOGLOBIN A1C: Hgb A1c MFr Bld: 6.5 % (ref 4.6–6.5)

## 2020-01-26 MED ORDER — METFORMIN HCL 500 MG PO TABS: ORAL_TABLET | ORAL | 2 refills | Status: DC

## 2020-01-26 NOTE — Patient Instructions (Signed)
If you do not hear anything about your referral in the next 1-2 weeks, call our office and ask for an update.  Give [place] 2-3 business days to get the results of your labs back.   Keep the diet clean and stay active.  The new Shingrix vaccine (for shingles) is a 2 shot series. It can make people feel low energy, achy and almost like they have the flu for 48 hours after injection. Please plan accordingly when deciding on when to get this shot. Call our office for a nurse visit appointment to get this. The second shot of the series is less severe regarding the side effects, but it still lasts 48 hours.   Let us know if you need anything.

## 2020-01-26 NOTE — Progress Notes (Signed)
Chief Complaint  Patient presents with  . Annual Exam    Well Male Alex Gray is here for a complete physical.   His last physical was >1 year ago.  Current diet: in general, a "healthy" diet.  Current exercise: walking, jogging Weight trend: slightly down Fatigue out of ordinary? No. Seat belt? Yes.    Health maintenance Shingrix- No Colonoscopy- No Tetanus- Yes HIV- Yes Hep C- No   Past Medical History:  Diagnosis Date  . Carpal tunnel syndrome   . Diabetes mellitus without complication ([place])   . Essential hypertension 01/18/2016  . High cholesterol   . Obesity 01/18/2016      Past Surgical History:  Procedure Laterality Date  . KNEE ARTHROSCOPY      Medications  Current Outpatient Medications on File Prior to Visit  Medication Sig Dispense Refill  . amLODipine (NORVASC) 10 MG tablet Take 1 tablet (10 mg total) by mouth daily. 90 tablet 3  . amLODipine (NORVASC) 5 MG tablet TAKE 2 TABLETS BY MOUTH EVERY DAY 180 tablet 0  . Blood Glucose Monitoring Suppl (ONETOUCH VERIO) w/Device KIT Use once daily to check blood sugar 1 kit 0  . dapagliflozin propanediol (FARXIGA) 10 MG TABS tablet Take 10 mg by mouth daily before breakfast. 90 tablet 2  . glucose blood (ONETOUCH VERIO) test strip Use once daily to check blood sugar.  E11.9 100 each 1  . metFORMIN (GLUCOPHAGE) 500 MG tablet Take 2 tabs twice daily. 360 tablet 2  . ONE TOUCH LANCETS MISC Use once daily to check blood sugar.  E11.9 200 each 1  . pantoprazole (PROTONIX) 40 MG tablet Take 1 tablet (40 mg total) by mouth daily. 90 tablet 2   Allergies No Known Allergies  Family History Family History  Problem Relation Age of Onset  . Diabetes Mother   . Hypertension Father   . Diabetes Father     Review of Systems: Constitutional:  no fevers Eye:  no recent significant change in vision Ear/Nose/Mouth/Throat:  Ears:  no hearing loss Nose/Mouth/Throat:  no complaints of nasal congestion, no sore throat  Cardiovascular:  no chest pain Respiratory:  no shortness of breath Gastrointestinal:  no change in bowel habits GU:  Male: negative for dysuria, frequency Musculoskeletal/Extremities:  no joint pain Integumentary (Skin/Breast):  no abnormal skin lesions reported Neurologic:  no headaches Endocrine: No unexpected weight changes Hematologic/Lymphatic:  no abnormal bleeding  Exam BP 130/78 (BP Location: Left Arm, Patient Position: Sitting, Cuff Size: Normal)   Pulse 74   Temp 98.4 F (36.9 C) (Oral)   Ht _0  (1.905 m)   Wt 276 lb (125.2 kg)   SpO2 98%   BMI 34.50 kg/m  General:  well developed, well nourished, in no apparent distress Skin:  no significant moles, warts, or growths Head:  no masses, lesions, or tenderness Eyes:  pupils equal and round, sclera anicteric without injection Ears:  canals without lesions, TMs shiny without retraction, no obvious effusion, no erythema Nose:  nares patent, septum midline, mucosa normal Throat/Pharynx:  lips and gingiva without lesion; tongue and uvula midline; non-inflamed pharynx; no exudates or postnasal drainage Neck: neck supple without adenopathy, thyromegaly, or masses Cardiac: RRR, no bruits, no LE edema Lungs:  clear to auscultation, breath sounds equal bilaterally, no respiratory distress Rectal: Deferred Musculoskeletal:  symmetrical muscle groups noted without atrophy or deformity Neuro:  gait normal; deep tendon reflexes normal and symmetric Psych: well oriented with normal range of affect and appropriate judgment/insight  Assessment  and Plan  Well adult exam  Need for influenza vaccination - Plan: Flu Vaccine QUAD 6+ mos PF IM (Fluarix Quad PF)  Diabetes mellitus type 2 in obese ([place]) - Plan: Lipid panel, Hemoglobin A1c, Comprehensive metabolic panel, Microalbumin / creatinine urine ratio, metFORMIN (GLUCOPHAGE) 500 MG tablet  Encounter for hepatitis C screening test for low risk patient - Plan: Hepatitis C antibody   Screen for colon cancer - Plan: Ambulatory referral to Gastroenterology  Screening for prostate cancer - Plan: PSA   Well 54 y.o. male. Counseled on diet and exercise. Counseled on risks and benefits of prostate cancer screening with PSA. The patient agrees to undergo testing. Immunizations, labs, and further orders as above. Follow up in 6 mo for DM visit or prn. The patient voiced understanding and agreement to the plan.  [place], DO 01/26/20 7:47 AM

## 2020-01-27 LAB — HEPATITIS C ANTIBODY
Hepatitis C Ab: NONREACTIVE
SIGNAL TO CUT-OFF: 0.05 (ref <1.00)

## 2020-03-07 ENCOUNTER — Telehealth (INDEPENDENT_AMBULATORY_CARE_PROVIDER_SITE_OTHER): Payer: 59 | Admitting: Family Medicine

## 2020-03-07 ENCOUNTER — Encounter: Payer: Self-pay | Admitting: Family Medicine

## 2020-03-07 DIAGNOSIS — H938X2 Other specified disorders of left ear: Secondary | ICD-10-CM | POA: Diagnosis not present

## 2020-03-07 MED ORDER — PREDNISONE 20 MG PO TABS: 40.0000 mg | ORAL_TABLET | Freq: Every day | ORAL | 0 refills | Status: AC

## 2020-03-07 NOTE — Progress Notes (Signed)
Chief Complaint  Patient presents with  . Otalgia    Pt is here for left ear pain. Due to COVID-19 pandemic, we are interacting via web portal for an electronic face-to-face visit. I verified patient's ID using 2 identifiers. Patient agreed to proceed with visit via this method. Patient is at home, I am at home. Patient and I are present for visit.   Duration: 2 days Progression: unchanged  Sharp pain + pressure Associated symptoms: Dizziness Denies: sore throat, congestion, fevers, post nasal drip, sneezing, bleeding, or discharge from ear Treatment to date: ibuprofen helped   Past Medical History:  Diagnosis Date  . Carpal tunnel syndrome   . Diabetes mellitus without complication ([place])   . Essential hypertension 01/18/2016  . High cholesterol   . Obesity 01/18/2016   Objective No conversational dyspnea Age appropriate judgment and insight Nml affect and mood  Ear pressure, left - Plan: predniSONE (DELTASONE) 20 MG tablet  5 d pred burst, 40 mg/d. Epley Maneuver info sent on [facility]. He will alert [place] if anything changes. Letter for work to be sent via [employer]. F/u prn.  Pt voiced understanding and agreement to the plan.  [place], DO 03/07/20 11:20 AM

## 2020-03-07 NOTE — Patient Instructions (Signed)
How to Perform the Epley Maneuver The Epley maneuver is an exercise that relieves symptoms of vertigo. Vertigo is the feeling that you or your surroundings are moving when they are not. When you feel vertigo, you may feel like the room is spinning and may have trouble walking. The Epley maneuver is used for a type of vertigo caused by a calcium deposit in a part of the inner ear. The maneuver involves changing head positions to help the deposit move out of the area. You can do this maneuver at home whenever you have symptoms of vertigo. You can repeat it in 24 hours if your vertigo has not gone away. Even though the Epley maneuver may relieve your vertigo for a few weeks, it is possible that your symptoms will return. This maneuver relieves vertigo, but it does not relieve dizziness. What are the risks? If it is done correctly, the Epley maneuver is considered safe. Sometimes it can lead to dizziness or nausea that goes away after a short time. If you develop other symptoms--such as changes in vision, weakness, or numbness--stop doing the maneuver and call your health care provider. Supplies needed:  A bed or table.  A pillow. How to do the Epley maneuver 1. Sit on the edge of a bed or table with your back straight and your legs extended or hanging over the edge of the bed or table. 2. Turn your head halfway toward the affected ear or side as told by your health care provider. 3. Lie backward quickly with your head turned until you are lying flat on your back. You may want to position a pillow under your shoulders. 4. Hold this position for at least 30 seconds. If you feel dizzy or have symptoms of vertigo, continue to hold the position until the symptoms stop. 5. Turn your head to the opposite direction until your unaffected ear is facing the floor. 6. Hold this position for at least 30 seconds. If you feel dizzy or have symptoms of vertigo, continue to hold the position until the symptoms  stop. 7. Turn your whole body to the same side as your head so that you are positioned on your side. Your head will now be nearly facedown. Hold for at least 30 seconds. If you feel dizzy or have symptoms of vertigo, continue to hold the position until the symptoms stop. 8. Sit back up. You can repeat the maneuver in 24 hours if your vertigo does not go away.      Follow these instructions at home: For 24 hours after doing the Epley maneuver:  Keep your head in an upright position.  When lying down to sleep or rest, keep your head raised (elevated) with two or more pillows.  Avoid excessive neck movements. Activity  Do not drive or use machinery if you feel dizzy.  After doing the Epley maneuver, return to your normal activities as told by your health care provider. Ask your health care provider what activities are safe for you. General instructions  Drink enough fluid to keep your urine pale yellow.  Do not drink alcohol.  Take over-the-counter and prescription medicines only as told by your health care provider.  Keep all follow-up visits as told by your health care provider. This is important. Preventing vertigo symptoms Ask your health care provider if there is anything you should do at home to prevent vertigo. He or she may recommend that you:  Keep your head elevated with two or more pillows while you sleep.    Do not sleep on the side of your affected ear.  Get up slowly from bed.  Avoid sudden movements during the day.  Avoid extreme head positions or movement, such as looking up or bending over. Contact a health care provider if:  Your vertigo gets worse.  You have other symptoms, including: ? Nausea. ? Vomiting. ? Headache. Get help right away if you:  Have vision changes.  Have a headache or neck pain that is severe or getting worse.  Cannot stop vomiting.  Have new numbness or weakness in any part of your body. Summary  Vertigo is the feeling that  you or your surroundings are moving when they are not.  The Epley maneuver is an exercise that relieves symptoms of vertigo.  If the Epley maneuver is done correctly, it is considered safe and relieves vertigo quickly. This information is not intended to replace advice given to you by your health care provider. Make sure you discuss any questions you have with your health care provider. Document Revised: 12/03/2018 Document Reviewed: 12/03/2018 Elsevier Patient Education  2021 Elsevier Inc.  

## 2020-03-25 ENCOUNTER — Other Ambulatory Visit: Payer: Self-pay | Admitting: Family Medicine

## 2020-04-18 ENCOUNTER — Encounter: Payer: Self-pay | Admitting: Family Medicine

## 2020-07-02 ENCOUNTER — Other Ambulatory Visit: Payer: Self-pay | Admitting: Family Medicine

## 2020-07-02 DIAGNOSIS — I10 Essential (primary) hypertension: Secondary | ICD-10-CM

## 2020-07-27 ENCOUNTER — Ambulatory Visit: Payer: 59 | Admitting: Family Medicine

## 2020-10-12 ENCOUNTER — Other Ambulatory Visit: Payer: Self-pay | Admitting: Family Medicine

## 2020-10-31 ENCOUNTER — Other Ambulatory Visit: Payer: Self-pay | Admitting: Family Medicine

## 2020-10-31 MED ORDER — DAPAGLIFLOZIN PROPANEDIOL 10 MG PO TABS: 10.0000 mg | ORAL_TABLET | Freq: Every day | ORAL | 2 refills | Status: DC

## 2020-11-26 ENCOUNTER — Other Ambulatory Visit: Payer: Self-pay | Admitting: Family Medicine

## 2020-11-26 DIAGNOSIS — K219 Gastro-esophageal reflux disease without esophagitis: Secondary | ICD-10-CM

## 2021-01-20 ENCOUNTER — Encounter: Payer: Self-pay | Admitting: Family Medicine

## 2021-01-23 ENCOUNTER — Other Ambulatory Visit: Payer: Self-pay | Admitting: Family Medicine

## 2021-01-23 MED ORDER — DAPAGLIFLOZIN PROPANEDIOL 10 MG PO TABS: 10.0000 mg | ORAL_TABLET | Freq: Every day | ORAL | 2 refills | Status: DC

## 2021-01-25 ENCOUNTER — Telehealth: Payer: Self-pay | Admitting: Family Medicine

## 2021-01-25 NOTE — Telephone Encounter (Signed)
Initiated PA for [employer] for Covermymeds KEY:  [id number] Waiting response

## 2021-01-27 NOTE — Telephone Encounter (Signed)
Received Denial for Farxiga

## 2021-02-04 ENCOUNTER — Other Ambulatory Visit: Payer: Self-pay | Admitting: Family Medicine

## 2021-02-04 DIAGNOSIS — E1169 Type 2 diabetes mellitus with other specified complication: Secondary | ICD-10-CM

## 2021-02-04 DIAGNOSIS — E669 Obesity, unspecified: Secondary | ICD-10-CM

## 2021-03-12 ENCOUNTER — Other Ambulatory Visit: Payer: Self-pay | Admitting: Family Medicine

## 2021-03-12 DIAGNOSIS — K219 Gastro-esophageal reflux disease without esophagitis: Secondary | ICD-10-CM

## 2021-03-28 ENCOUNTER — Encounter: Payer: 59 | Admitting: Family Medicine

## 2021-03-30 ENCOUNTER — Other Ambulatory Visit: Payer: Self-pay | Admitting: Family Medicine

## 2021-03-30 DIAGNOSIS — I10 Essential (primary) hypertension: Secondary | ICD-10-CM

## 2021-04-03 ENCOUNTER — Encounter: Payer: Self-pay | Admitting: Family Medicine

## 2021-04-05 ENCOUNTER — Encounter: Payer: Self-pay | Admitting: Family Medicine

## 2021-04-12 ENCOUNTER — Encounter: Payer: Self-pay | Admitting: Family Medicine

## 2021-04-13 ENCOUNTER — Encounter: Payer: Self-pay | Admitting: Family Medicine

## 2021-04-13 ENCOUNTER — Telehealth (INDEPENDENT_AMBULATORY_CARE_PROVIDER_SITE_OTHER): Payer: No Typology Code available for payment source | Admitting: Family Medicine

## 2021-04-13 DIAGNOSIS — U071 COVID-19: Secondary | ICD-10-CM | POA: Diagnosis not present

## 2021-04-13 DIAGNOSIS — Z1211 Encounter for screening for malignant neoplasm of colon: Secondary | ICD-10-CM | POA: Diagnosis not present

## 2021-04-13 MED ORDER — FLUTICASONE PROPIONATE HFA 110 MCG/ACT IN AERO: 2.0000 | INHALATION_SPRAY | Freq: Two times a day (BID) | RESPIRATORY_TRACT | 0 refills | Status: DC

## 2021-04-13 NOTE — Progress Notes (Signed)
Chief Complaint  Patient presents with   Cough    Nasal congestion   Wheezing    Metta Clines here for URI complaints. Due to COVID-19 pandemic, we are interacting via web portal for an electronic face-to-face visit. I verified patient's ID using 2 identifiers. Patient agreed to proceed with visit via this method. Patient is at home, I am at office. Patient and I are present for visit.   Duration: 2 weeks  Associated symptoms: sinus congestion, wheezing, shortness of breath, loss of taste/smell, and coughing Denies: sinus pain, rhinorrhea, itchy watery eyes, ear pain, ear drainage, sore throat, myalgia, and fevers, N/V/D Treatment to date: Elderberry, Coricidin  Sick contacts: No Tested + for covid and then negative.   Past Medical History:  Diagnosis Date   Carpal tunnel syndrome    Diabetes mellitus without complication ([place])    Essential hypertension 01/18/2016   High cholesterol    Obesity 01/18/2016    Objective No conversational dyspnea Age appropriate judgment and insight Nml affect and mood  COVID-19 - Plan: fluticasone (FLOVENT HFA) 110 MCG/ACT inhaler  Screening for colon cancer - Plan: Ambulatory referral to Gastroenterology  Continue to push fluids, practice good hand hygiene, cover mouth when coughing. Already outside of CDC quarantining guidelines.  F/u as originally scheduled in a few weeks for CPE. If starting to experience fevers, shaking, or shortness of breath, seek immediate care. Pt voiced understanding and agreement to the plan.  [place], DO 04/13/21 7:39 AM

## 2021-04-17 ENCOUNTER — Encounter: Payer: Self-pay | Admitting: Family Medicine

## 2021-04-23 ENCOUNTER — Encounter: Payer: Self-pay | Admitting: Family Medicine

## 2021-04-26 ENCOUNTER — Encounter: Payer: Self-pay | Admitting: Family Medicine

## 2021-04-26 ENCOUNTER — Ambulatory Visit (INDEPENDENT_AMBULATORY_CARE_PROVIDER_SITE_OTHER): Payer: No Typology Code available for payment source | Admitting: Family Medicine

## 2021-04-26 VITALS — BP 134/86 | HR 61 | Temp 98.6°F | Ht 75.0 in | Wt 281.2 lb

## 2021-04-26 DIAGNOSIS — E1169 Type 2 diabetes mellitus with other specified complication: Secondary | ICD-10-CM

## 2021-04-26 DIAGNOSIS — Z0000 Encounter for general adult medical examination without abnormal findings: Secondary | ICD-10-CM | POA: Diagnosis not present

## 2021-04-26 DIAGNOSIS — E669 Obesity, unspecified: Secondary | ICD-10-CM

## 2021-04-26 DIAGNOSIS — B353 Tinea pedis: Secondary | ICD-10-CM | POA: Diagnosis not present

## 2021-04-26 DIAGNOSIS — Z1211 Encounter for screening for malignant neoplasm of colon: Secondary | ICD-10-CM

## 2021-04-26 DIAGNOSIS — Z125 Encounter for screening for malignant neoplasm of prostate: Secondary | ICD-10-CM

## 2021-04-26 DIAGNOSIS — K219 Gastro-esophageal reflux disease without esophagitis: Secondary | ICD-10-CM

## 2021-04-26 LAB — LIPID PANEL
Cholesterol: 173 mg/dL (ref 0–200)
HDL: 49.4 mg/dL (ref >39.00)
LDL Cholesterol: 99 mg/dL (ref 0–99)
NonHDL: 123.27
Total CHOL/HDL Ratio: 3
Triglycerides: 123 mg/dL (ref 0.0–149.0)
VLDL: 24.6 mg/dL (ref 0.0–40.0)

## 2021-04-26 LAB — COMPREHENSIVE METABOLIC PANEL
ALT: 24 U/L (ref 0–53)
AST: 19 U/L (ref 0–37)
Albumin: 4.4 g/dL (ref 3.5–5.2)
Alkaline Phosphatase: 104 U/L (ref 39–117)
BUN: 15 mg/dL (ref 6–23)
CO2: 31 mEq/L (ref 19–32)
Calcium: 9.5 mg/dL (ref 8.4–10.5)
Chloride: 99 mEq/L (ref 96–112)
Creatinine, Ser: 1.01 mg/dL (ref 0.40–1.50)
GFR: 84.23 mL/min (ref >60.00)
Glucose, Bld: 241 mg/dL — ABNORMAL HIGH (ref 70–99)
Potassium: 4.1 mEq/L (ref 3.5–5.1)
Sodium: 137 mEq/L (ref 135–145)
Total Bilirubin: 1 mg/dL (ref 0.2–1.2)
Total Protein: 7.2 g/dL (ref 6.0–8.3)

## 2021-04-26 LAB — CBC
HCT: 46.5 % (ref 39.0–52.0)
Hemoglobin: 15 g/dL (ref 13.0–17.0)
MCHC: 32.2 g/dL (ref 30.0–36.0)
MCV: 80.8 fl (ref 78.0–100.0)
Platelets: 233 10*3/uL (ref 150.0–400.0)
RBC: 5.76 Mil/uL (ref 4.22–5.81)
RDW: 14.1 % (ref 11.5–15.5)
WBC: 3.6 10*3/uL — ABNORMAL LOW (ref 4.0–10.5)

## 2021-04-26 LAB — MICROALBUMIN / CREATININE URINE RATIO
Creatinine,U: 216.3 mg/dL
Microalb Creat Ratio: 2.3 mg/g (ref 0.0–30.0)
Microalb, Ur: 5 mg/dL — ABNORMAL HIGH (ref 0.0–1.9)

## 2021-04-26 LAB — PSA: PSA: 0.48 ng/mL (ref 0.10–4.00)

## 2021-04-26 LAB — HEMOGLOBIN A1C: Hgb A1c MFr Bld: 8.8 % — ABNORMAL HIGH (ref 4.6–6.5)

## 2021-04-26 MED ORDER — DAPAGLIFLOZIN PROPANEDIOL 10 MG PO TABS: 10.0000 mg | ORAL_TABLET | Freq: Every day | ORAL | 2 refills | Status: DC

## 2021-04-26 MED ORDER — KETOCONAZOLE 2 % EX CREA: 1.0000 "application " | TOPICAL_CREAM | Freq: Every day | CUTANEOUS | 0 refills | Status: AC

## 2021-04-26 MED ORDER — PANTOPRAZOLE SODIUM 40 MG PO TBEC: 40.0000 mg | DELAYED_RELEASE_TABLET | Freq: Every day | ORAL | 2 refills | Status: DC

## 2021-04-26 MED ORDER — METFORMIN HCL 500 MG PO TABS: ORAL_TABLET | ORAL | 2 refills | Status: DC

## 2021-04-26 NOTE — Patient Instructions (Addendum)
Give [place] 2-3 business days to get the results of your labs back.  ? ?Keep the diet clean and stay active. ? ?Please get me a copy of your advanced directive form at your convenience.  ? ?If you do not hear anything about your referral in the next 1-2 weeks, call our office and ask for an update. ? ?The Shingrix vaccine (for shingles) is a 2 shot series. It can make people feel low energy, achy and almost like they have the flu for 48 hours after injection. Please plan accordingly when deciding on when to get this shot. Call our office for a nurse visit appointment to get this. The second shot of the series is less severe regarding the side effects, but it still lasts 48 hours.  ? ?The only lifestyle changes that have data behind them are weight loss for the overweight/obese and elevating the head of the bed. Finding out which foods/positions are triggers is important. ? ?Let us know if you need anything. ? ?Healthy Eating Plan ?Many factors influence your heart health, including eating and exercise habits. Heart (coronary) risk increases with abnormal blood fat (lipid) levels. Heart-healthy meal planning includes limiting unhealthy fats, increasing healthy fats, and making other small dietary changes. This includes maintaining a healthy body weight to help keep lipid levels within a normal range. ? ?WHAT IS MY PLAN?  ?Your health care provider recommends that you: ?Drink a glass of water before meals to help with satiety. ?Eat slowly. ?An alternative to the water is to add Metamucil. This will help with satiety as well. It does contain calories, unlike water. ? ?WHAT TYPES OF FAT SHOULD I CHOOSE? ?Choose healthy fats more often. Choose monounsaturated and polyunsaturated fats, such as olive oil and canola oil, flaxseeds, walnuts, almonds, and seeds. ?Eat more omega-3 fats. Good choices include salmon, mackerel, sardines, tuna, flaxseed oil, and ground flaxseeds. Aim to eat fish at least two times each week. ?Avoid  foods with partially hydrogenated oils in them. These contain trans fats. Examples of foods that contain trans fats are stick margarine, some tub margarines, cookies, crackers, and other baked goods. If you are going to avoid a fat, this is the one to avoid! ? ?WHAT GENERAL GUIDELINES DO I NEED TO FOLLOW? ?Check food labels carefully to identify foods with trans fats. Avoid these types of options when possible. ?Fill one half of your plate with vegetables and green salads. Eat 4-5 servings of vegetables per day. A serving of vegetables equals 1 cup of raw leafy vegetables, ? cup of raw or cooked cut-up vegetables, or ? cup of vegetable juice. ?Fill one fourth of your plate with whole grains. Look for the word "whole" as the first word in the ingredient list. ?Fill one fourth of your plate with lean protein foods. ?Eat 4-5 servings of fruit per day. A serving of fruit equals one medium whole fruit, ? cup of dried fruit, ? cup of fresh, frozen, or canned fruit. Try to avoid fruits in cups/syrups as the sugar content can be high. ?Eat more foods that contain soluble fiber. Examples of foods that contain this type of fiber are apples, broccoli, carrots, beans, peas, and barley. Aim to get 20-30 g of fiber per day. ?Eat more home-cooked food and less restaurant, buffet, and fast food. ?Limit or avoid alcohol. ?Limit foods that are high in starch and sugar. ?Avoid fried foods when able. ?Cook foods by using methods other than frying. Baking, boiling, grilling, and broiling are all great  options. Other fat-reducing suggestions include: ?Removing the skin from poultry. ?Removing all visible fats from meats. ?Skimming the fat off of stews, soups, and gravies before serving them. ?Steaming vegetables in water or broth. ?Lose weight if you are overweight. Losing just 5-10% of your initial body weight can help your overall health and prevent diseases such as diabetes and heart disease. ?Increase your consumption of nuts,  legumes, and seeds to 4-5 servings per week. One serving of dried beans or legumes equals ? cup after being cooked, one serving of nuts equals 1? ounces, and one serving of seeds equals ? ounce or 1 tablespoon. ? ?WHAT ARE GOOD FOODS CAN I EAT? ?Grains ?Grainy breads (try to find bread that is 3 g of fiber per slice or greater), oatmeal, light popcorn. Whole-grain cereals. Rice and pasta, including brown rice and those that are made with whole wheat. Edamame pasta is a great alternative to grain pasta. It has a higher protein content. Try to avoid significant consumption of white bread, sugary cereals, or pastries/baked goods. ? ?Vegetables ?All vegetables. Cooked white potatoes do not count as vegetables. ? ?Fruits ?All fruits, but limit pineapple and bananas as these fruits have a higher sugar content. ? ?Meats and Other Protein Sources ?Lean, well-trimmed beef, veal, pork, and lamb. Chicken and [place] without skin. All fish and shellfish. Wild duck, rabbit, pheasant, and venison. Egg whites or low-cholesterol egg substitutes. Dried beans, peas, lentils, and tofu. Seeds and most nuts. ? ?Dairy ?Low-fat or nonfat cheeses, including ricotta, string, and mozzarella. Skim or 1% milk that is liquid, powdered, or evaporated. Buttermilk that is made with low-fat milk. Nonfat or low-fat yogurt. Soy/Almond milk are good alternatives if you cannot handle dairy. ? ?Beverages ?Water is the best for you. Sports drinks with less sugar are more desirable unless you are a highly active athlete. ? ?Sweets and Desserts ?Sherbets and fruit ices. Honey, jam, marmalade, jelly, and syrups. Dark chocolate.  ?Eat all sweets and desserts in moderation. ? ?Fats and Oils ?Nonhydrogenated (trans-free) margarines. Vegetable oils, including soybean, sesame, sunflower, olive, peanut, safflower, corn, canola, and cottonseed. Salad dressings or mayonnaise that are made with a vegetable oil. Limit added fats and oils that you use for cooking,  baking, salads, and as spreads. ? ?Other ?Cocoa powder. Coffee and tea. Most condiments. ? ?The items listed above may not be a complete list of recommended foods or beverages. Contact your dietitian for more options. ?

## 2021-04-26 NOTE — Progress Notes (Signed)
Chief Complaint  ?Patient presents with  ? Annual Exam  ?  Refill farxiga ?  ? ? ?Well Male ?Alex Gray is here for a complete physical.   ?His last physical was >1 year ago.  ?Current diet: in general, an "overall healthy" diet.  ?Current exercise: active at work/home ?Weight trend: stable ?Fatigue out of ordinary? No. ?Seat belt? Yes.   ?Advanced directive? No ? ?Health maintenance ?Shingrix- No ?Colonoscopy- No ?Tetanus- Yes ?HIV- Yes ?Hep C- Yes ?  ?Past Medical History:  ?Diagnosis Date  ? Carpal tunnel syndrome   ? Diabetes mellitus without complication ([place])   ? Essential hypertension 01/18/2016  ? High cholesterol   ? Obesity 01/18/2016  ?  ? ? ?Past Surgical History:  ?Procedure Laterality Date  ? KNEE ARTHROSCOPY    ? ? ?Medications  ?Current Outpatient Medications on File Prior to Visit  ?Medication Sig Dispense Refill  ? amLODipine (NORVASC) 5 MG tablet TAKE 2 TABLETS BY MOUTH EVERY DAY 180 tablet 1  ? atorvastatin (LIPITOR) 40 MG tablet TAKE 1 TABLET BY MOUTH EVERY DAY 90 tablet 3  ? Blood Glucose Monitoring Suppl (ONETOUCH VERIO) w/Device KIT Use once daily to check blood sugar 1 kit 0  ? dapagliflozin propanediol (FARXIGA) 10 MG TABS tablet Take 1 tablet (10 mg total) by mouth daily before breakfast. 30 tablet 2  ? fluticasone (FLOVENT HFA) 110 MCG/ACT inhaler Inhale 2 puffs into the lungs in the morning and at bedtime. Rinse mouth out after use. 1 each 0  ? glucose blood (ONETOUCH VERIO) test strip Use once daily to check blood sugar.  E11.9 100 each 1  ? metFORMIN (GLUCOPHAGE) 500 MG tablet Take 2 tabs twice daily. 360 tablet 2  ? ONE TOUCH LANCETS MISC Use once daily to check blood sugar.  E11.9 200 each 1  ? ? ?Allergies ?No Known Allergies ? ?Family History ?Family History  ?Problem Relation Age of Onset  ? Diabetes Mother   ? Hypertension Father   ? Diabetes Father   ? ? ?Review of Systems: ?Constitutional:  no fevers ?Eye:  no recent significant change in vision ?Ear/Nose/Mouth/Throat:  Ears:   no hearing loss ?Nose/Mouth/Throat:  no complaints of nasal congestion, no sore throat ?Cardiovascular:  no chest pain ?Respiratory:  no shortness of breath ?Gastrointestinal:  no change in bowel habits ?GU:  Male: negative for dysuria, frequency ?Musculoskeletal/Extremities:  no joint pain ?Integumentary (Skin/Breast):  no abnormal skin lesions reported ?Neurologic:  no headaches ?Endocrine: No unexpected weight changes ?Hematologic/Lymphatic:  no abnormal bleeding ? ?Exam ?BP 134/86   Pulse 61   Temp 98.6 ?F (37 ?C) (Oral)   Ht _0  (1.905 m)   Wt 281 lb 4 oz (127.6 kg)   SpO2 97%   BMI 35.15 kg/m?  ?General:  well developed, well nourished, in no apparent distress ?Skin: macerated tissue b/t 4/5 digit on the R; otherwise no significant moles, warts, or growths ?Head:  no masses, lesions, or tenderness ?Eyes:  pupils equal and round, sclera anicteric without injection ?Ears:  canals without lesions, TMs shiny without retraction, no obvious effusion, no erythema ?Nose:  nares patent, septum midline, mucosa normal ?Throat/Pharynx:  lips and gingiva without lesion; tongue and uvula midline; non-inflamed pharynx; no exudates or postnasal drainage ?Neck: neck supple without adenopathy, thyromegaly, or masses ?Cardiac: RRR, no bruits, no LE edema; DP pulses 2+ b/l ?Lungs:  clear to auscultation, breath sounds equal bilaterally, no respiratory distress ?Abdomen: BS+, soft, non-tender, non-distended, no masses or organomegaly noted ?  Rectal: Deferred ?Musculoskeletal:  symmetrical muscle groups noted without atrophy or deformity ?Neuro:  gait normal; deep tendon reflexes normal and symmetric ?Psych: well oriented with normal range of affect and appropriate judgment/insight ? ?Assessment and Plan ? ?Well adult exam - Plan: CBC, Comprehensive metabolic panel, Lipid panel ? ?Gastroesophageal reflux disease - Plan: pantoprazole (PROTONIX) 40 MG tablet ? ?Diabetes mellitus type 2 in obese ([place]) - Plan: Hemoglobin A1c,  Microalbumin / creatinine urine ratio ? ?Tinea pedis of right foot ? ?Screening for prostate cancer - Plan: PSA ? ?Screen for colon cancer - Plan: Ambulatory referral to Gastroenterology  ? ?Well 56 y.o. male. ?Counseled on diet and exercise. ?Counseled on risks and benefits of prostate cancer screening with PSA. The patient agrees to undergo testing. ?Shingrix rec'd.  ?CCS: refer GI.  ?TP: 6 week course of ketoconazole cream.  ?Advanced directive form provided today.  ?Immunizations, labs, and further orders as above. ?Follow up in 6 mo. ?The patient voiced understanding and agreement to the plan. ? ?Shelda Pal, DO ?04/26/21 ?11:01 AM ? ?

## 2021-04-27 ENCOUNTER — Encounter: Payer: Self-pay | Admitting: Family Medicine

## 2021-04-27 ENCOUNTER — Telehealth: Payer: Self-pay | Admitting: *Deleted

## 2021-04-27 ENCOUNTER — Other Ambulatory Visit: Payer: Self-pay | Admitting: Family Medicine

## 2021-04-27 MED ORDER — EMPAGLIFLOZIN 25 MG PO TABS: 25.0000 mg | ORAL_TABLET | Freq: Every day | ORAL | 2 refills | Status: DC

## 2021-04-27 NOTE — Telephone Encounter (Signed)
Insurance requires prior [place] on Farxiga.  They gave list of other medication that are covered.  Would you like to try one of those before trying prior auth? ? ?Glyxambi Not Required ?Januvia Not Required ?Jardiance Not Required ?MetFORMIN HCl Not Required ?Ozempic (0.25 or 0.5 MG/DOSE) Not Required ?Trijardy XR Not Required ?Trulicity Not Required ?Xigduo XR Not Required ?Xigduo XR Not Required ?

## 2021-04-30 ENCOUNTER — Encounter: Payer: Self-pay | Admitting: Family Medicine

## 2021-05-31 ENCOUNTER — Encounter: Payer: Self-pay | Admitting: Family Medicine

## 2021-05-31 ENCOUNTER — Ambulatory Visit: Payer: No Typology Code available for payment source | Admitting: Family Medicine

## 2021-05-31 VITALS — BP 138/82 | HR 80 | Temp 97.7°F | Ht 75.0 in | Wt 279.0 lb

## 2021-05-31 DIAGNOSIS — E669 Obesity, unspecified: Secondary | ICD-10-CM | POA: Diagnosis not present

## 2021-05-31 DIAGNOSIS — E1169 Type 2 diabetes mellitus with other specified complication: Secondary | ICD-10-CM

## 2021-05-31 LAB — HEMOGLOBIN A1C: Hgb A1c MFr Bld: 7.4 % — ABNORMAL HIGH (ref 4.6–6.5)

## 2021-05-31 NOTE — Progress Notes (Signed)
Chief Complaint  ?Patient presents with  ? Follow-up  ?  1 month DM  ? ? ?Subjective: ?Patient is a 56 y.o. male here for f/u DM. ? ?A1c was 8.8 on 04/26/21. Jardiance 25 mg/d was added to metformin 1000 mg bid. He has not been checking his sugars. He has been eating much better.  Walking and cycling for exercise.  No chest pain or shortness of breath. ? ?Past Medical History:  ?Diagnosis Date  ? Carpal tunnel syndrome   ? Diabetes mellitus without complication ([place])   ? Essential hypertension 01/18/2016  ? High cholesterol   ? Obesity 01/18/2016  ? ? ?Objective: ?BP 138/82   Pulse 80   Temp 97.7 ?F (36.5 ?C) (Oral)   Ht '6\' 3"'$  (1.905 m)   Wt 279 lb (126.6 kg)   SpO2 98%   BMI 34.87 kg/m?  ?General: Awake, appears stated age ?Heart: RRR, no LE edema ?Lungs: CTAB, no rales, wheezes or rhonchi. No accessory muscle use ?Psych: Age appropriate judgment and insight, normal affect and mood ? ?Assessment and Plan: ?Diabetes mellitus type 2 in obese ([place]) - Plan: Hemoglobin A1c ? ?Chronic, not controlled.  Check A1c today.  Continue Jardiance 25 mg daily, metformin 1000 mg twice daily.  If not controlled, will add Ozempic 0.25 mg weekly for 4 doses and then increase to 0.5 mg weekly.  We discussed this medication and he is agreeable to start if needed.  Counseled on diet and exercise.  Follow-up pending the above results. ?The patient voiced understanding and agreement to the plan. ? ?Shelda Pal, DO ?05/31/21  ?10:31 AM ? ? ? ? ?

## 2021-05-31 NOTE — Patient Instructions (Signed)
Give us 2-3 business days to get the results of your labs back.   Keep the diet clean and stay active.  Let us know if you need anything. 

## 2021-06-01 ENCOUNTER — Encounter: Payer: Self-pay | Admitting: Family Medicine

## 2021-06-20 ENCOUNTER — Telehealth: Payer: Self-pay | Admitting: Family Medicine

## 2021-06-20 NOTE — Telephone Encounter (Signed)
PCP received from [facility] notice of recall of Jardiance ?PCP would like to change/alternative for this drug ?Called the patient to ask/let know of change---no answer//no VM  ?

## 2021-06-21 ENCOUNTER — Telehealth: Payer: Self-pay | Admitting: *Deleted

## 2021-06-21 MED ORDER — RYBELSUS 7 MG PO TABS: 7.0000 mg | ORAL_TABLET | Freq: Every day | ORAL | 2 refills | Status: DC

## 2021-06-21 NOTE — Telephone Encounter (Signed)
Prior auth started via cover my meds.  Awaiting determination. ? ?Key: [id number] ?

## 2021-06-21 NOTE — Telephone Encounter (Signed)
Spoke to the patient and made aware of recall of jardiance. ?He is ok for PCP to change this medication. ?

## 2021-06-21 NOTE — Telephone Encounter (Signed)
Called the patient no answer/no VM 

## 2021-06-26 NOTE — Telephone Encounter (Signed)
Called the patient informed of approval. ?He stated he got the medication yesterday. ?

## 2021-06-26 NOTE — Telephone Encounter (Signed)
PA was approved.  Paperwork sent to scan.  Did not get approval dates. ?

## 2021-07-19 ENCOUNTER — Encounter: Payer: Self-pay | Admitting: Gastroenterology

## 2021-08-10 ENCOUNTER — Ambulatory Visit (AMBULATORY_SURGERY_CENTER): Payer: Self-pay | Admitting: *Deleted

## 2021-08-10 ENCOUNTER — Other Ambulatory Visit: Payer: Self-pay

## 2021-08-10 VITALS — Ht 75.0 in | Wt 265.0 lb

## 2021-08-10 DIAGNOSIS — Z1211 Encounter for screening for malignant neoplasm of colon: Secondary | ICD-10-CM

## 2021-08-10 MED ORDER — NA SULFATE-K SULFATE-MG SULF 17.5-3.13-1.6 GM/177ML PO SOLN: 1.0000 | Freq: Once | ORAL | 0 refills | Status: AC

## 2021-08-10 NOTE — Progress Notes (Signed)
Pre visit conducted in person.    No egg or soy allergy known to patient  No issues known to pt with past sedation with any surgeries or procedures Patient denies ever being told they had issues or difficulty with intubation  No FH of Malignant Hyperthermia Pt is not on diet pills Pt is not on  home 02  Pt is not on blood thinners  Pt denies issues with constipation  No A fib or A flutter  Discussed with pt there will be an out-of-pocket cost for prep and that varies from $0 to 70 +  dollars - pt verbalized understanding  Pt instructed to use Singlecare.com or GoodRx for a price reduction on prep   PV completed over the phone. Pt verified name, DOB, address and insurance during PV today.  Pt mailed instruction packet with copy of consent form to read and not return, and instructions.  Pt encouraged to call with questions or issues.  If pt has My chart, procedure instructions sent via My Chart  Insurance confirmed with pt at [facility] [place] today

## 2021-08-30 ENCOUNTER — Ambulatory Visit: Payer: No Typology Code available for payment source | Admitting: Family Medicine

## 2021-09-07 ENCOUNTER — Ambulatory Visit: Payer: No Typology Code available for payment source | Admitting: Family Medicine

## 2021-09-11 ENCOUNTER — Encounter: Payer: No Typology Code available for payment source | Admitting: Gastroenterology

## 2021-09-15 ENCOUNTER — Ambulatory Visit: Payer: No Typology Code available for payment source | Admitting: Family Medicine

## 2021-09-15 ENCOUNTER — Encounter: Payer: Self-pay | Admitting: Family Medicine

## 2021-09-15 VITALS — BP 118/82 | HR 73 | Temp 98.3°F | Ht 75.0 in | Wt 259.4 lb

## 2021-09-15 DIAGNOSIS — K219 Gastro-esophageal reflux disease without esophagitis: Secondary | ICD-10-CM | POA: Diagnosis not present

## 2021-09-15 DIAGNOSIS — E669 Obesity, unspecified: Secondary | ICD-10-CM | POA: Diagnosis not present

## 2021-09-15 DIAGNOSIS — E1169 Type 2 diabetes mellitus with other specified complication: Secondary | ICD-10-CM | POA: Diagnosis not present

## 2021-09-15 LAB — HEMOGLOBIN A1C: Hgb A1c MFr Bld: 6.2 % (ref 4.6–6.5)

## 2021-09-15 MED ORDER — PANTOPRAZOLE SODIUM 40 MG PO TBEC: 40.0000 mg | DELAYED_RELEASE_TABLET | Freq: Every day | ORAL | 2 refills | Status: DC

## 2021-09-15 NOTE — Patient Instructions (Signed)
Give [place] 2-3 business days to get the results of your labs back.   Stay hydrated.  Very strong work with your weight loss.   Let us know if you need anything.

## 2021-09-15 NOTE — Progress Notes (Signed)
Subjective:   Chief Complaint  Patient presents with   Follow-up    3 month     Alex Gray is a 56 y.o. male here for follow-up of diabetes.   Alex Gray has not been checking his sugars at home.  Patient does not require insulin.   Medications include: Metformin 1000 mg bid, Rybelsus 7 mg/d Diet is healthier.  Exercise: lifting wts, walking, running, some cycling No Cp or SOB.   GERD Hx of GeRD on Protonix 40 mg/d prn. S/s's worse w spicy foods. Getting better w wt loss. No bleeding, dysphagia, unintentional wt loss, pain.   Past Medical History:  Diagnosis Date   Arthritis    Carpal tunnel syndrome    Diabetes mellitus without complication ([place])    Essential hypertension 01/18/2016   High cholesterol    Obesity 01/18/2016     Related testing: Retinal exam: Done Pneumovax: done  Objective:  BP 118/82   Pulse 73   Temp 98.3 F (36.8 C) (Oral)   Ht '6\' 3"'$  (1.905 m)   Wt 259 lb 6 oz (117.7 kg)   SpO2 97%   BMI 32.42 kg/m  General:  Well developed, well nourished, in no apparent distress Lungs:  CTAB, no access msc use Abd: BS+, NT, ND Cardio:  RRR, no bruits, no LE edema Psych: Age appropriate judgment and insight  Assessment:   Diabetes mellitus type 2 in obese ([place]) - Plan: Hemoglobin A1c  Gastroesophageal reflux disease - Plan: pantoprazole (PROTONIX) 40 MG tablet   Plan:   Chronic,probably stable. Cont Metformin 1000 mg bid. Counseled on diet and exercise. Chronic, stable. Cont Protonix 40 mg qd prn. Discussed reflux precautions.  F/u in 6 mo. The patient voiced understanding and agreement to the plan.  [place], DO 09/15/21 10:15 AM

## 2021-09-22 ENCOUNTER — Other Ambulatory Visit: Payer: Self-pay | Admitting: Family Medicine

## 2021-09-26 ENCOUNTER — Encounter: Payer: Self-pay | Admitting: Family Medicine

## 2021-09-27 ENCOUNTER — Encounter: Payer: Self-pay | Admitting: Gastroenterology

## 2021-10-03 ENCOUNTER — Encounter: Payer: Self-pay | Admitting: Family Medicine

## 2021-10-03 ENCOUNTER — Ambulatory Visit (AMBULATORY_SURGERY_CENTER): Payer: No Typology Code available for payment source | Admitting: Gastroenterology

## 2021-10-03 ENCOUNTER — Encounter: Payer: Self-pay | Admitting: Gastroenterology

## 2021-10-03 VITALS — BP 118/85 | HR 64 | Temp 97.8°F | Resp 12 | Ht 75.0 in | Wt 265.0 lb

## 2021-10-03 DIAGNOSIS — Z800 Family history of malignant neoplasm of digestive organs: Secondary | ICD-10-CM

## 2021-10-03 DIAGNOSIS — D122 Benign neoplasm of ascending colon: Secondary | ICD-10-CM

## 2021-10-03 DIAGNOSIS — D123 Benign neoplasm of transverse colon: Secondary | ICD-10-CM | POA: Diagnosis not present

## 2021-10-03 DIAGNOSIS — Z1211 Encounter for screening for malignant neoplasm of colon: Secondary | ICD-10-CM | POA: Diagnosis not present

## 2021-10-03 MED ORDER — SODIUM CHLORIDE 0.9 % IV SOLN: 500.0000 mL | Freq: Once | INTRAVENOUS | Status: DC

## 2021-10-03 NOTE — Patient Instructions (Signed)
Read all of the handouts given to you by your recovery room nurse.  YOU HAD AN ENDOSCOPIC PROCEDURE TODAY AT THE [facility]:   Refer to the procedure report that was given to you for any specific questions about what was found during the examination.  If the procedure report does not answer your questions, please call your gastroenterologist to clarify.  If you requested that your care partner not be given the details of your procedure findings, then the procedure report has been included in a sealed envelope for you to review at your convenience later.  YOU SHOULD EXPECT: Some feelings of bloating in the abdomen. Passage of more gas than usual.  Walking can help get rid of the air that was put into your GI tract during the procedure and reduce the bloating. If you had a lower endoscopy (such as a colonoscopy or flexible sigmoidoscopy) you may notice spotting of blood in your stool or on the toilet paper. If you underwent a bowel prep for your procedure, you may not have a normal bowel movement for a few days.  Please Note:  You might notice some irritation and congestion in your nose or some drainage.  This is from the oxygen used during your procedure.  There is no need for concern and it should clear up in a day or so.  SYMPTOMS TO REPORT IMMEDIATELY:  Following lower endoscopy (colonoscopy or flexible sigmoidoscopy):  Excessive amounts of blood in the stool  Significant tenderness or worsening of abdominal pains  Swelling of the abdomen that is new, acute  Fever of 100F or higher   For urgent or emergent issues, a gastroenterologist can be reached at any hour by calling [phone]. Do not use MyChart messaging for urgent concerns.    DIET:  We do recommend a small meal at first, but then you may proceed to your regular diet.  Drink plenty of fluids but you should avoid alcoholic beverages for 24 hours.  ACTIVITY:  You should plan to take it easy for the rest of today and  you should NOT DRIVE or use heavy machinery until tomorrow (because of the sedation medicines used during the test).    FOLLOW UP: Our staff will call the number listed on your records the next business day following your procedure.  We will call around 7:15- 8:00 am to check on you and address any questions or concerns that you may have regarding the information given to you following your procedure. If we do not reach you, we will leave a message.  If you develop any symptoms (ie: fever, flu-like symptoms, shortness of breath, cough etc.) before then, please call [phone].  If you test positive for Covid 19 in the 2 weeks post procedure, please call and report this information to us.    If any biopsies were taken you will be contacted by phone or by letter within the next 1-3 weeks.  Please call us at [phone] if you have not heard about the biopsies in 3 weeks.    SIGNATURES/CONFIDENTIALITY: You and/or your care partner have signed paperwork which will be entered into your electronic medical record.  These signatures attest to the fact that that the information above on your After Visit Summary has been reviewed and is understood.  Full responsibility of the confidentiality of this discharge information lies with you and/or your care-partner.  

## 2021-10-03 NOTE — Op Note (Signed)
[facility] Patient Name: Alex Gray Procedure Date: 10/03/2021 10:47 AM MRN: [id number] Endoscopist: Remo Lipps P. Havery Moros , MD Age: 56 Referring MD:  Date of Birth: [birth year] Gender: Male Account #: [id number] Procedure:                Colonoscopy Indications:              Screening in patient at increased risk: Family                            history of 1st-degree relative with colorectal                            cancer (brother age 20s), This is the patient's                            first colonoscopy Medicines:                Monitored Anesthesia Care Procedure:                Pre-Anesthesia Assessment:                           - Prior to the procedure, a History and Physical                            was performed, and patient medications and                            allergies were reviewed. The patient's tolerance of                            previous anesthesia was also reviewed. The risks                            and benefits of the procedure and the sedation                            options and risks were discussed with the patient.                            All questions were answered, and informed consent                            was obtained. Prior Anticoagulants: The patient has                            taken no previous anticoagulant or antiplatelet                            agents. ASA Grade Assessment: II - A patient with                            mild systemic disease. After reviewing the risks  and benefits, the patient was deemed in                            satisfactory condition to undergo the procedure.                           After obtaining informed consent, the colonoscope                            was passed under direct vision. Throughout the                            procedure, the patient's blood pressure, pulse, and                            oxygen saturations were monitored continuously.  The                            Olympus CF-HQ190L [id number]) Colonoscope was                            introduced through the anus and advanced to the the                            cecum, identified by appendiceal orifice and                            ileocecal valve. The colonoscopy was performed                            without difficulty. The patient tolerated the                            procedure well. The quality of the bowel                            preparation was adequate. The ileocecal valve,                            appendiceal orifice, and rectum were photographed. Scope In: 10:53:47 AM Scope Out: 11:24:28 AM Scope Withdrawal Time: 0 hours 22 minutes 31 seconds  Total Procedure Duration: 0 hours 30 minutes 41 seconds  Findings:                 The perianal and digital rectal examinations were                            normal.                           A 4 mm polyp was found in the ascending colon. The                            polyp was sessile. The polyp was removed with a  cold snare. Resection and retrieval were complete.                           A 4 mm polyp was found in the hepatic flexure. The                            polyp was sessile. The polyp was removed with a                            cold snare. Resection and retrieval were complete.                           A few small-mouthed diverticula were found in the                            left colon and right colon.                           Internal hemorrhoids were found during retroflexion.                           A large amount of liquid stool was found in the                            entire colon, making visualization difficult.                            Lavage of the colon was performed using copious                            amounts of sterile water, resulting in clearance                            with adequate visualization. This process prolonged                             the exam.                           The exam was otherwise without abnormality. Complications:            No immediate complications. Estimated blood loss:                            Minimal. Estimated Blood Loss:     Estimated blood loss was minimal. Impression:               - One 4 mm polyp in the ascending colon, removed                            with a cold snare. Resected and retrieved.                           - One 4 mm polyp at the hepatic flexure, removed  with a cold snare. Resected and retrieved.                           - Diverticulosis in the left colon and in the right                            colon.                           - Internal hemorrhoids.                           - Stool in the entire examined colon requiring                            extensive lavage.                           - The examination was otherwise normal. Recommendation:           - Patient has a contact number available for                            emergencies. The signs and symptoms of potential                            delayed complications were discussed with the                            patient. Return to normal activities tomorrow.                            Written discharge instructions were provided to the                            patient.                           - Resume previous diet.                           - Continue present medications.                           - Await pathology results. Anticipate repeat                            colonoscopy in 5 years given family history of                            colon cancer Alex Balfour P. Anh Mangano, MD 10/03/2021 11:29:23 AM This report has been signed electronically.

## 2021-10-03 NOTE — Progress Notes (Signed)
Report to PACU, RN, vss, BBS= Clear.  

## 2021-10-03 NOTE — Progress Notes (Signed)
Called to room to assist during endoscopic procedure.  Patient ID and intended procedure confirmed with present staff. Received instructions for my participation in the procedure from the performing physician.  

## 2021-10-03 NOTE — Progress Notes (Signed)
[facility] History and Physical   Primary Care Physician:  Shelda Pal, DO   Reason for Procedure:   Family history of colon cancer  Plan:    colonoscopy     HPI: Alex Gray is a 56 y.o. male  here for colonoscopy screening - first time exam. Brother had colon cancer dx age 26s. Patient denies any bowel symptoms at this time.  Otherwise feels well without any cardiopulmonary symptoms.   I have discussed risks / benefits of anesthesia and endoscopic procedure with Alex Gray and they wish to proceed with the exams as outlined today.    Past Medical History:  Diagnosis Date   Arthritis    Carpal tunnel syndrome    Diabetes mellitus without complication ([place])    Essential hypertension 01/18/2016   High cholesterol    Obesity 01/18/2016    Past Surgical History:  Procedure Laterality Date   COLONOSCOPY     KNEE ARTHROSCOPY      Prior to Admission medications   Medication Sig Start Date End Date Taking? Authorizing Provider  amLODipine (NORVASC) 5 MG tablet TAKE 2 TABLETS BY MOUTH EVERY DAY 03/30/21  Yes Shelda Pal, DO  atorvastatin (LIPITOR) 40 MG tablet TAKE 1 TABLET BY MOUTH EVERY DAY 02/06/21  Yes Shelda Pal, DO  metFORMIN (GLUCOPHAGE) 500 MG tablet Take 2 tabs twice daily. 04/26/21  Yes Wendling, Crosby Oyster, DO  RYBELSUS 7 MG TABS TAKE 7 MG BY MOUTH DAILY. 09/22/21  Yes Shelda Pal, DO  Blood Glucose Monitoring Suppl (ONETOUCH VERIO) w/Device KIT Use once daily to check blood sugar 02/20/18   Wendling, Crosby Oyster, DO  fluticasone (FLOVENT HFA) 110 MCG/ACT inhaler Inhale 2 puffs into the lungs in the morning and at bedtime. Rinse mouth out after use. Patient not taking: Reported on 10/03/2021 04/13/21   Shelda Pal, DO  glucose blood [place] VERIO) test strip Use once daily to check blood sugar.  E11.9 02/20/18   Shelda Pal, DO  meloxicam (MOBIC) 15 MG tablet Take 15 mg by mouth daily.  04/04/21   [provider]  ONE TOUCH LANCETS MISC Use once daily to check blood sugar.  E11.9 09/26/17   Shelda Pal, DO  pantoprazole (PROTONIX) 40 MG tablet Take 1 tablet (40 mg total) by mouth daily. 09/15/21   Shelda Pal, DO    Current Outpatient Medications  Medication Sig Dispense Refill   amLODipine (NORVASC) 5 MG tablet TAKE 2 TABLETS BY MOUTH EVERY DAY 180 tablet 1   atorvastatin (LIPITOR) 40 MG tablet TAKE 1 TABLET BY MOUTH EVERY DAY 90 tablet 3   metFORMIN (GLUCOPHAGE) 500 MG tablet Take 2 tabs twice daily. 360 tablet 2   RYBELSUS 7 MG TABS TAKE 7 MG BY MOUTH DAILY. 30 tablet 2   Blood Glucose Monitoring Suppl (ONETOUCH VERIO) w/Device KIT Use once daily to check blood sugar 1 kit 0   fluticasone (FLOVENT HFA) 110 MCG/ACT inhaler Inhale 2 puffs into the lungs in the morning and at bedtime. Rinse mouth out after use. (Patient not taking: Reported on 10/03/2021) 1 each 0   glucose blood (ONETOUCH VERIO) test strip Use once daily to check blood sugar.  E11.9 100 each 1   meloxicam (MOBIC) 15 MG tablet Take 15 mg by mouth daily.     ONE TOUCH LANCETS MISC Use once daily to check blood sugar.  E11.9 200 each 1   pantoprazole (PROTONIX) 40 MG tablet Take 1 tablet (40 mg total)  by mouth daily. 90 tablet 2   Current Facility-Administered Medications  Medication Dose Route Frequency Provider Last Rate Last Admin   0.9 %  sodium chloride infusion  500 mL Intravenous Once Aniello Christopoulos, Carlota Raspberry, MD        Allergies as of 10/03/2021   (No Known Allergies)    Family History  Problem Relation Age of Onset   Diabetes Mother    Hypertension Father    Diabetes Father    Colon polyps Brother    Colon cancer Brother    Esophageal cancer Neg Hx    Stomach cancer Neg Hx    Rectal cancer Neg Hx     Social History   Socioeconomic History   Marital status: Single    Spouse name: Not on file   Number of children: Not on file   Years of education: Not on file    Highest education level: Not on file  Occupational History   Not on file  Tobacco Use   Smoking status: Never   Smokeless tobacco: Never  Vaping Use   Vaping Use: Never used  Substance and Sexual Activity   Alcohol use: No   Drug use: No   Sexual activity: Not on file  Other Topics Concern   Not on file  Social History Narrative   Not on file   Social Determinants of Health   Financial Resource Strain: Not on file  Food Insecurity: Not on file  Transportation Needs: Not on file  Physical Activity: Not on file  Stress: Not on file  Social Connections: Not on file  Intimate Partner Violence: Not on file    Review of Systems: All other review of systems negative except as mentioned in the HPI.  Physical Exam: Vital signs BP (!) 147/85   Pulse 75   Temp 97.8 F (36.6 C) (Temporal)   Resp (!) 23   Ht 6' 3"  (1.905 m)   Wt 265 lb (120.2 kg)   SpO2 99%   BMI 33.12 kg/m   General:   Alert,  Well-developed, pleasant and cooperative in NAD Lungs:  Clear throughout to auscultation.   Heart:  Regular rate and rhythm Abdomen:  Soft, nontender and nondistended.   Neuro/Psych:  Alert and cooperative. Normal mood and affect. A and O x 3  Jolly Mango, MD [facility]

## 2021-10-04 ENCOUNTER — Telehealth: Payer: Self-pay

## 2021-10-04 NOTE — Telephone Encounter (Signed)
  Follow up Call-     10/03/2021   10:24 AM  Call back number  Post procedure Call Back phone  # [phone]  Permission to leave phone message Yes     Patient questions:  Do you have a fever, pain , or abdominal swelling? No. Pain Score  0 *  Have you tolerated food without any problems? Yes.    Have you been able to return to your normal activities? Yes.    Do you have any questions about your discharge instructions: Diet   No. Medications  No. Follow up visit  No.  Do you have questions or concerns about your Care? No.  Actions: * If pain score is 4 or above: No action needed, pain <4.

## 2021-10-22 ENCOUNTER — Other Ambulatory Visit: Payer: Self-pay | Admitting: Family Medicine

## 2021-10-22 DIAGNOSIS — I10 Essential (primary) hypertension: Secondary | ICD-10-CM

## 2021-10-25 ENCOUNTER — Encounter: Payer: Self-pay | Admitting: Gastroenterology

## 2021-11-01 ENCOUNTER — Ambulatory Visit: Payer: No Typology Code available for payment source | Admitting: Family Medicine

## 2021-11-09 ENCOUNTER — Ambulatory Visit: Payer: No Typology Code available for payment source | Admitting: Family Medicine

## 2021-11-29 ENCOUNTER — Encounter: Payer: Self-pay | Admitting: Family Medicine

## 2021-11-29 ENCOUNTER — Ambulatory Visit: Payer: No Typology Code available for payment source | Admitting: Family Medicine

## 2021-11-29 VITALS — BP 122/80 | HR 70 | Temp 98.1°F | Ht 75.0 in | Wt 250.0 lb

## 2021-11-29 DIAGNOSIS — E669 Obesity, unspecified: Secondary | ICD-10-CM

## 2021-11-29 DIAGNOSIS — I10 Essential (primary) hypertension: Secondary | ICD-10-CM

## 2021-11-29 DIAGNOSIS — Z23 Encounter for immunization: Secondary | ICD-10-CM

## 2021-11-29 DIAGNOSIS — E1169 Type 2 diabetes mellitus with other specified complication: Secondary | ICD-10-CM | POA: Diagnosis not present

## 2021-11-29 MED ORDER — OLMESARTAN MEDOXOMIL 20 MG PO TABS: 20.0000 mg | ORAL_TABLET | Freq: Every day | ORAL | 2 refills | Status: DC

## 2021-11-29 NOTE — Progress Notes (Signed)
Subjective:   Chief Complaint  Patient presents with   Follow-up    6 month    Alex Gray is a 56 y.o. male here for follow-up of diabetes.   Alex Gray does not  Patient does not require insulin.   Medications include: metformin 1000 mg bid, Rybelsus 7 mg/d Diet is healthy.  Exercise: walking  Hypertension Patient presents for hypertension follow up. He does monitor home blood pressures. Blood pressures ranging on average from 120's/70's. He is compliant with medication- Norvasc 10 mg/d. Patient has these side effects of medication: none Diet/exercise as above. No CP or SOB.   Past Medical History:  Diagnosis Date   Arthritis    Carpal tunnel syndrome    Diabetes mellitus without complication ([place])    Essential hypertension 01/18/2016   High cholesterol    Obesity 01/18/2016     Related testing: Retinal exam: Due Pneumovax: done  Objective:  BP 122/80 (BP Location: Left Arm, Patient Position: Sitting, Cuff Size: Large)   Pulse 70   Temp 98.1 F (36.7 C) (Oral)   Ht '6\' 3"'$  (1.905 m)   Wt 250 lb (113.4 kg)   SpO2 96%   BMI 31.25 kg/m  General:  Well developed, well nourished, in no apparent distress Skin:  Warm, no pallor or diaphoresis Head:  Normocephalic, atraumatic Eyes:  Pupils equal and round, sclera anicteric without injection  Lungs:  CTAB, no access msc use Psych: Age appropriate judgment and insight  Assessment:   Diabetes mellitus type 2 in obese ([place]) - Plan: Comprehensive metabolic panel, Lipid panel  Essential hypertension  Need for influenza vaccination - Plan: Flu Vaccine QUAD 6+ mos PF IM (Fluarix Quad PF)   Plan:   Counseled on diet and exercise. Cont Metformin 1000 mg bid, Rybelsus 7 mg/d. He has done a nice job losing weight and improving his diet. Counseled on exercise. He does need to schedule his eye exam.  Chronic, stable. W dx of diabetes, I do want to changge him from Norvasc to an ARB, will send in olmesartan 20 mg/d.  Flu shot  today.  F/u in 6 mo. The patient voiced understanding and agreement to the plan.  [place], DO 11/29/21 4:32 PM

## 2021-11-29 NOTE — Patient Instructions (Addendum)
Give [place] 2-3 business days to get the results of your labs back.   Keep the diet clean and stay active.  Very strong work with your weight loss  Schedule your eye exam please.   The Shingrix vaccine (for shingles) is a 2 shot series spaced 2-6 months apart. It can make people feel low energy, achy and almost like they have the flu for 48 hours after injection. 1/5 people can have nausea and/or vomiting. Please plan accordingly when deciding on when to get this shot. Call our office for a nurse visit appointment to get this. The second shot of the series is less severe regarding the side effects, but it still lasts 48 hours.   Let us know if you need anything.

## 2021-11-30 LAB — LIPID PANEL
Cholesterol: 152 mg/dL (ref 0–200)
HDL: 42.6 mg/dL (ref >39.00)
LDL Cholesterol: 91 mg/dL (ref 0–99)
NonHDL: 109.33
Total CHOL/HDL Ratio: 4
Triglycerides: 94 mg/dL (ref 0.0–149.0)
VLDL: 18.8 mg/dL (ref 0.0–40.0)

## 2021-11-30 LAB — COMPREHENSIVE METABOLIC PANEL
ALT: 10 U/L (ref 0–53)
AST: 15 U/L (ref 0–37)
Albumin: 4.2 g/dL (ref 3.5–5.2)
Alkaline Phosphatase: 77 U/L (ref 39–117)
BUN: 11 mg/dL (ref 6–23)
CO2: 30 mEq/L (ref 19–32)
Calcium: 9.5 mg/dL (ref 8.4–10.5)
Chloride: 98 mEq/L (ref 96–112)
Creatinine, Ser: 0.97 mg/dL (ref 0.40–1.50)
GFR: 88.05 mL/min (ref >60.00)
Glucose, Bld: 80 mg/dL (ref 70–99)
Potassium: 3.8 mEq/L (ref 3.5–5.1)
Sodium: 136 mEq/L (ref 135–145)
Total Bilirubin: 0.6 mg/dL (ref 0.2–1.2)
Total Protein: 7.4 g/dL (ref 6.0–8.3)

## 2021-12-26 ENCOUNTER — Other Ambulatory Visit: Payer: Self-pay | Admitting: Family Medicine

## 2022-01-10 IMAGING — CR DG CHEST 2V
2 series · 2 of 2 positions shown · non-contrast
Comparison: 12/01/2015

CLINICAL DATA: Chest pain

EXAM:
CHEST - 2 VIEW

[w chest pa]
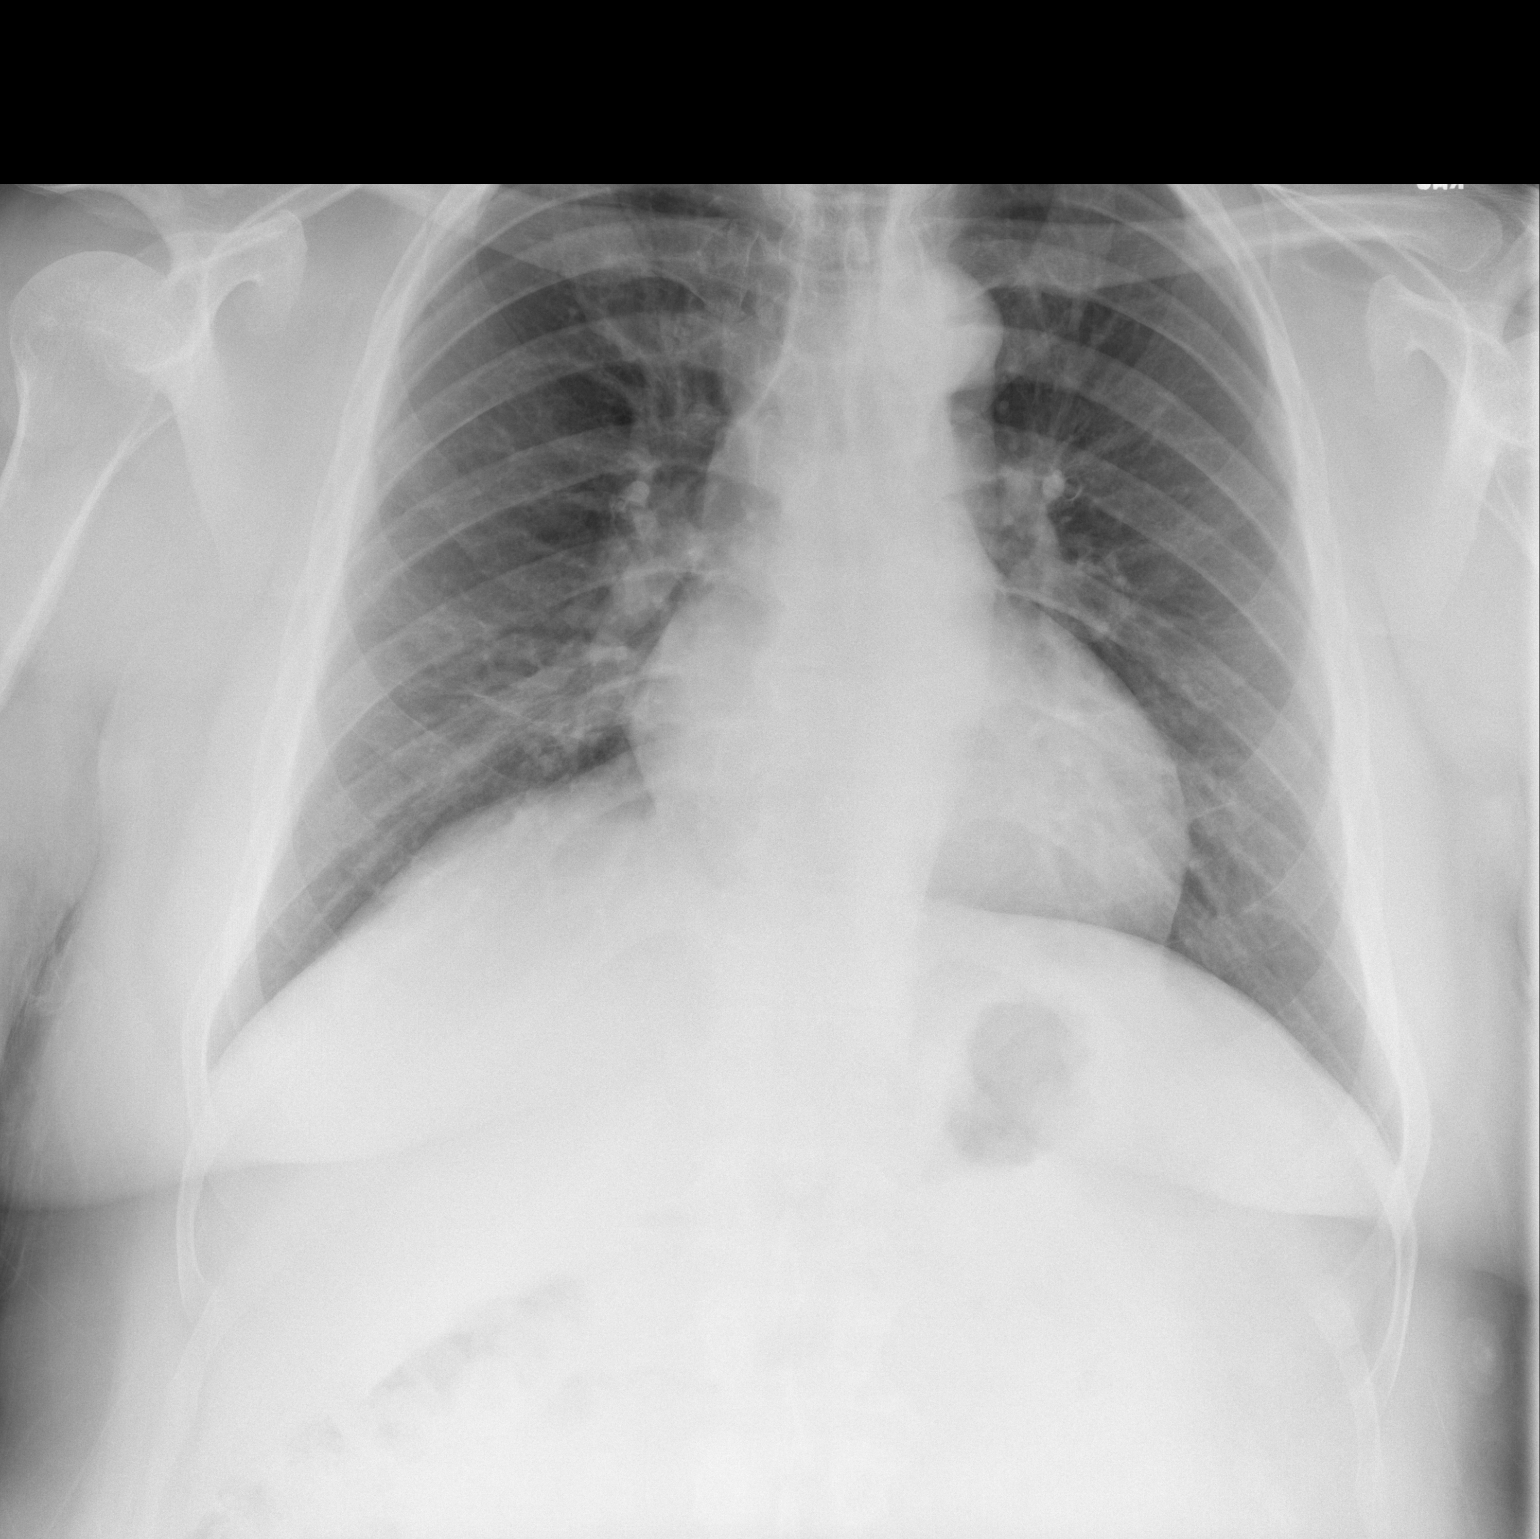

[w chest lat]
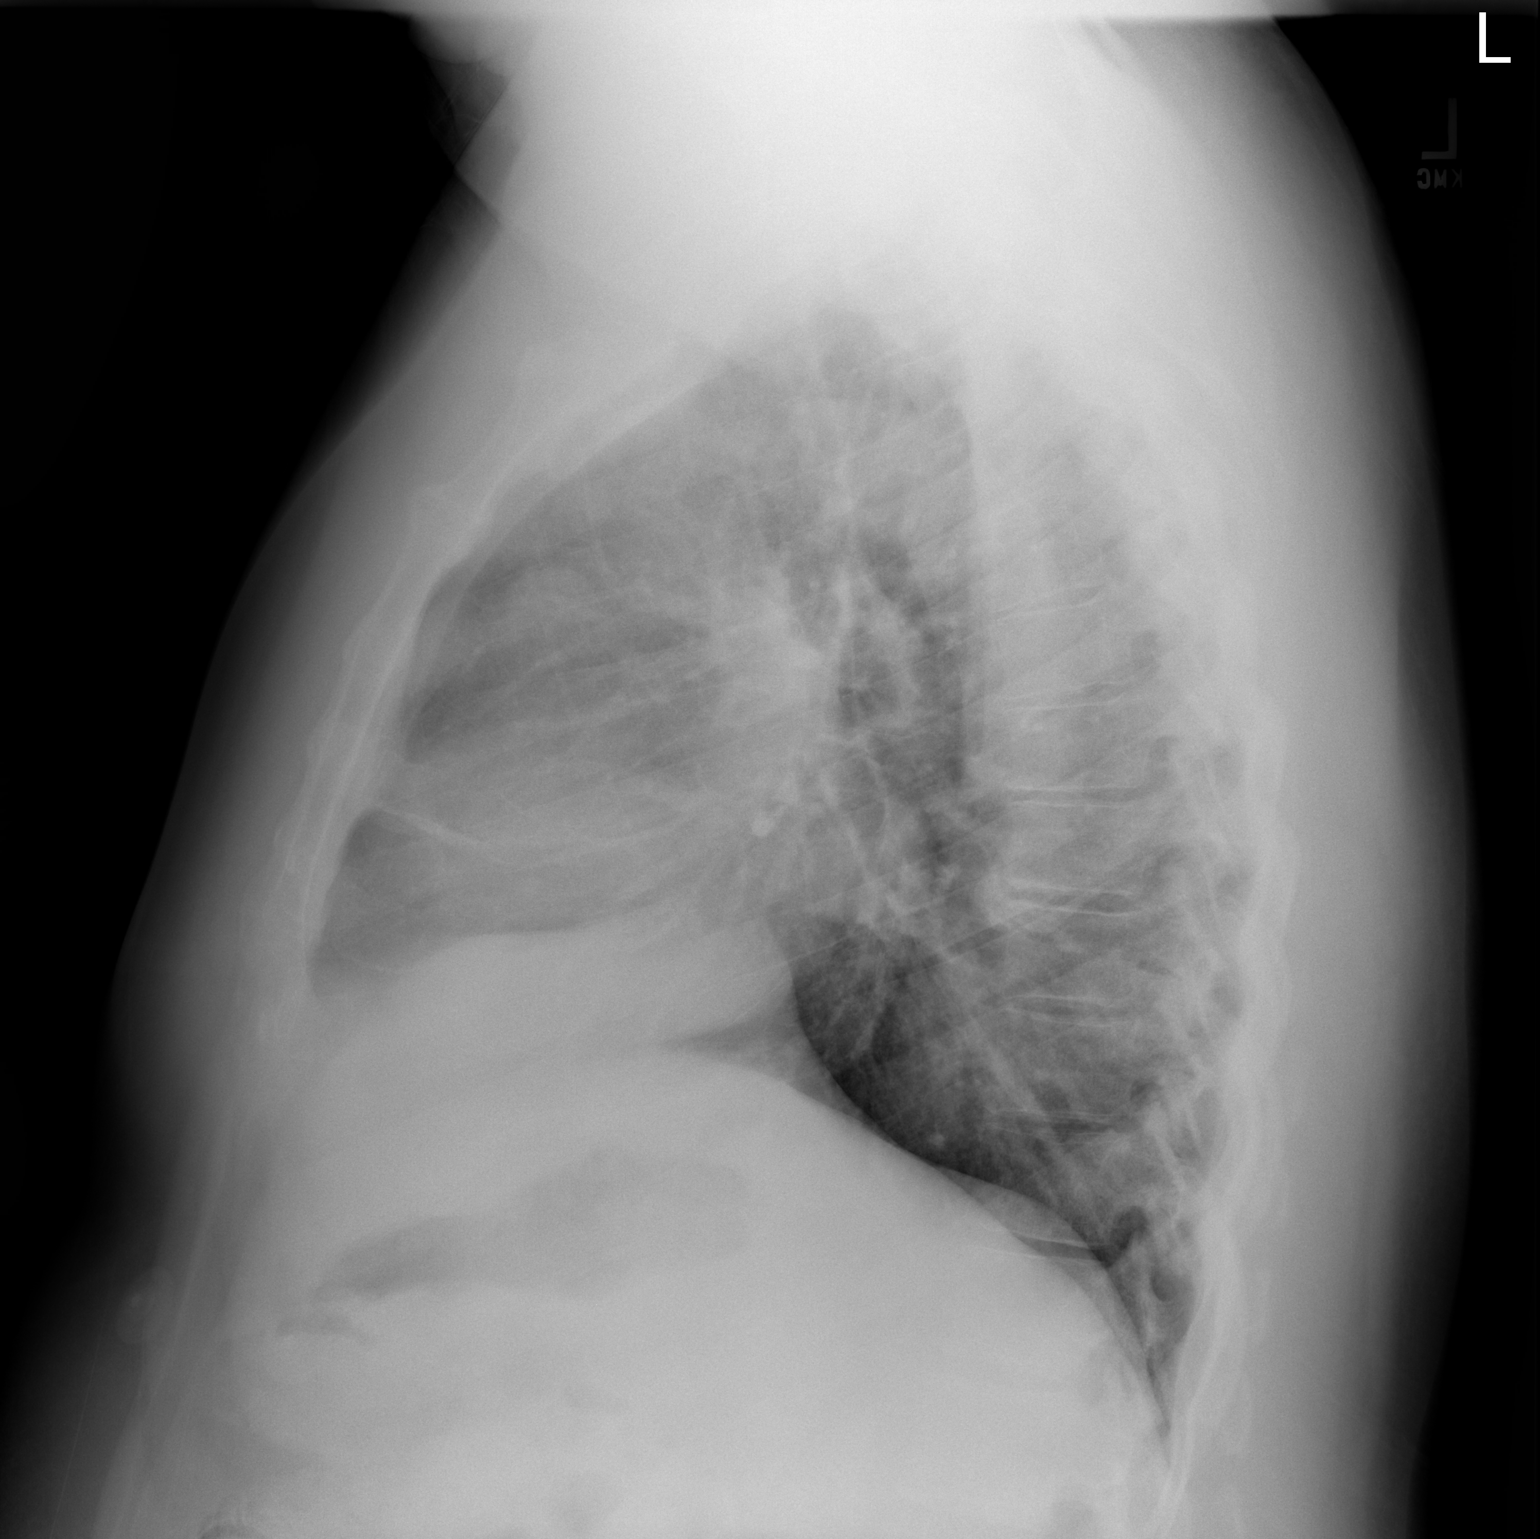

[2 of 2 positions shown; findings below may reference images not displayed]

FINDINGS: Probable scarring at the bases. No acute consolidation or effusion.
Stable cardiomediastinal silhouette. No pneumothorax.
IMPRESSION: No active cardiopulmonary disease.  Probable scarring at the bases

## 2022-02-24 ENCOUNTER — Other Ambulatory Visit: Payer: Self-pay | Admitting: Family Medicine

## 2022-03-23 ENCOUNTER — Ambulatory Visit: Payer: No Typology Code available for payment source | Admitting: Family Medicine

## 2022-03-30 ENCOUNTER — Other Ambulatory Visit: Payer: Self-pay | Admitting: Family Medicine

## 2022-05-29 ENCOUNTER — Other Ambulatory Visit: Payer: Self-pay | Admitting: Family Medicine

## 2022-06-06 ENCOUNTER — Encounter: Payer: No Typology Code available for payment source | Admitting: Family Medicine

## 2022-06-19 ENCOUNTER — Encounter: Payer: Self-pay | Admitting: Family Medicine

## 2022-06-19 ENCOUNTER — Ambulatory Visit (INDEPENDENT_AMBULATORY_CARE_PROVIDER_SITE_OTHER): Payer: No Typology Code available for payment source | Admitting: Family Medicine

## 2022-06-19 VITALS — BP 120/78 | HR 84 | Temp 99.1°F | Ht 75.0 in | Wt 252.0 lb

## 2022-06-19 DIAGNOSIS — E1169 Type 2 diabetes mellitus with other specified complication: Secondary | ICD-10-CM

## 2022-06-19 DIAGNOSIS — Z0000 Encounter for general adult medical examination without abnormal findings: Secondary | ICD-10-CM | POA: Diagnosis not present

## 2022-06-19 DIAGNOSIS — E669 Obesity, unspecified: Secondary | ICD-10-CM | POA: Diagnosis not present

## 2022-06-19 DIAGNOSIS — Z125 Encounter for screening for malignant neoplasm of prostate: Secondary | ICD-10-CM

## 2022-06-19 NOTE — Patient Instructions (Addendum)
Give us 2-3 business days to get the results of your labs back.   Keep the diet clean and stay active.  Please get me a copy of your advanced directive form at your convenience.   The Shingrix vaccine (for shingles) is a 2 shot series spaced 2-6 months apart. It can make people feel low energy, achy and almost like they have the flu for 48 hours after injection. 1/5 people can have nausea and/or vomiting. Please plan accordingly when deciding on when to get this shot. Call our office for a nurse visit appointment to get this. The second shot of the series is less severe regarding the side effects, but it still lasts 48 hours.   Foods that may reduce pain: 1) Ginger 2) Blueberries 3) Salmon 4) Pumpkin seeds 5) dark chocolate 6) turmeric 7) tart cherries 8) virgin olive oil 9) chilli peppers 10) mint 11) krill oil   Let us know if you need anything.  

## 2022-06-19 NOTE — Progress Notes (Signed)
Chief Complaint  Patient presents with   Annual Exam    Well Male Alex Gray is here for a complete physical.   His last physical was >1 year ago.  Current diet: in general, a "healthy" diet.  Current exercise: walking Weight trend: intentionally losing Fatigue out of ordinary? No. Seat belt? Yes.   Advanced directive? Yes  Health maintenance Shingrix- No Colonoscopy- Yes Tetanus- Yes HIV- Yes Hep C- Yes   Past Medical History:  Diagnosis Date   Arthritis    Carpal tunnel syndrome    Diabetes mellitus without complication ([facility])    Essential hypertension 01/18/2016   High cholesterol    Obesity 01/18/2016      Past Surgical History:  Procedure Laterality Date   COLONOSCOPY     KNEE ARTHROSCOPY      Medications  Current Outpatient Medications on File Prior to Visit  Medication Sig Dispense Refill   atorvastatin (LIPITOR) 40 MG tablet TAKE 1 TABLET BY MOUTH EVERY DAY 90 tablet 3   Blood Glucose Monitoring Suppl (ONETOUCH VERIO) w/Device KIT Use once daily to check blood sugar 1 kit 0   fluticasone (FLOVENT HFA) 110 MCG/ACT inhaler Inhale 2 puffs into the lungs in the morning and at bedtime. Rinse mouth out after use. 1 each 0   glucose blood (ONETOUCH VERIO) test strip Use once daily to check blood sugar.  E11.9 100 each 1   meloxicam (MOBIC) 15 MG tablet Take 15 mg by mouth daily.     metFORMIN (GLUCOPHAGE) 500 MG tablet Take 2 tabs twice daily. 360 tablet 2   olmesartan (BENICAR) 20 MG tablet TAKE 1 TABLET BY MOUTH EVERY DAY 90 tablet 0   ONE TOUCH LANCETS MISC Use once daily to check blood sugar.  E11.9 200 each 1   pantoprazole (PROTONIX) 40 MG tablet Take 1 tablet (40 mg total) by mouth daily. 90 tablet 2   RYBELSUS 7 MG TABS TAKE 1 TABLET BY MOUTH EVERY DAY 30 tablet 2   Allergies No Known Allergies  Family History Family History  Problem Relation Age of Onset   Diabetes Mother    Hypertension Father    Diabetes Father    Colon polyps Brother     Colon cancer Brother    Esophageal cancer Neg Hx    Stomach cancer Neg Hx    Rectal cancer Neg Hx     Review of Systems: Constitutional:  no fevers Eye:  no recent significant change in vision Ear/Nose/Mouth/Throat:  Ears:  no hearing loss Nose/Mouth/Throat:  no complaints of nasal congestion, no sore throat Cardiovascular:  no chest pain Respiratory:  no shortness of breath Gastrointestinal:  no change in bowel habits GU:  Male: negative for dysuria, frequency Musculoskeletal/Extremities:  no joint pain Integumentary (Skin/Breast):  no abnormal skin lesions reported Neurologic:  no headaches Endocrine: No unexpected weight changes Hematologic/Lymphatic:  no abnormal bleeding  Exam BP 120/78 (BP Location: Left Arm, Patient Position: Sitting, Cuff Size: Large)   Pulse 84   Temp 99.1 F (37.3 C) (Oral)   Ht 6\' 3"  (1.905 m)   Wt 252 lb (114.3 kg)   SpO2 97%   BMI 31.50 kg/m  General:  well developed, well nourished, in no apparent distress Skin:  no significant moles, warts, or growths Head:  no masses, lesions, or tenderness Eyes:  pupils equal and round, sclera anicteric without injection Ears:  canals without lesions, TMs shiny without retraction, no obvious effusion, no erythema Nose:  nares patent, mucosa normal Throat/Pharynx:  lips and gingiva without lesion; tongue and uvula midline; non-inflamed pharynx; no exudates or postnasal drainage Neck: neck supple without adenopathy, thyromegaly, or masses Cardiac: RRR, no bruits, no LE edema; DP pulses 2+ b/l Lungs:  clear to auscultation, breath sounds equal bilaterally, no respiratory distress Abdomen: BS+, soft, non-tender, non-distended, no masses or organomegaly noted Rectal: Deferred Musculoskeletal:  symmetrical muscle groups noted without atrophy or deformity Neuro: sensation intact to pinprick b/l; gait normal; deep tendon reflexes normal and symmetric Psych: well oriented with normal range of affect and  appropriate judgment/insight  Assessment and Plan  Well adult exam - Plan: CBC, Comprehensive metabolic panel, Lipid panel  Type 2 diabetes mellitus with obesity ([facility]) - Plan: Hemoglobin A1c, Microalbumin / creatinine urine ratio  Screening for prostate cancer - Plan: PSA   Well 57 y.o. male. Counseled on diet and exercise. Counseled on risks and benefits of prostate cancer screening with PSA. The patient agrees to undergo testing. Advanced directive form requested today.  Shingrix rec'd.  Due for eye exam. Rec'd to schedule.   Immunizations, labs, and further orders as above. Follow up in 6 mo. The patient voiced understanding and agreement to the plan.  Jilda Roche [place], DO 06/19/22 3:03 PM

## 2022-06-20 LAB — PSA: PSA: 0.71 ng/mL (ref 0.10–4.00)

## 2022-06-20 LAB — COMPREHENSIVE METABOLIC PANEL
ALT: 11 U/L (ref 0–53)
AST: 16 U/L (ref 0–37)
Albumin: 4.2 g/dL (ref 3.5–5.2)
Alkaline Phosphatase: 68 U/L (ref 39–117)
BUN: 14 mg/dL (ref 6–23)
CO2: 31 mEq/L (ref 19–32)
Calcium: 9.7 mg/dL (ref 8.4–10.5)
Chloride: 103 mEq/L (ref 96–112)
Creatinine, Ser: 1.04 mg/dL (ref 0.40–1.50)
GFR: 80.67 mL/min (ref >60.00)
Glucose, Bld: 79 mg/dL (ref 70–99)
Potassium: 4.3 mEq/L (ref 3.5–5.1)
Sodium: 142 mEq/L (ref 135–145)
Total Bilirubin: 0.6 mg/dL (ref 0.2–1.2)
Total Protein: 6.9 g/dL (ref 6.0–8.3)

## 2022-06-20 LAB — LIPID PANEL
Cholesterol: 195 mg/dL (ref 0–200)
HDL: 47.4 mg/dL (ref >39.00)
LDL Cholesterol: 115 mg/dL — ABNORMAL HIGH (ref 0–99)
NonHDL: 147.68
Total CHOL/HDL Ratio: 4
Triglycerides: 161 mg/dL — ABNORMAL HIGH (ref 0.0–149.0)
VLDL: 32.2 mg/dL (ref 0.0–40.0)

## 2022-06-20 LAB — CBC
HCT: 45.7 % (ref 39.0–52.0)
Hemoglobin: 14.9 g/dL (ref 13.0–17.0)
MCHC: 32.6 g/dL (ref 30.0–36.0)
MCV: 82.2 fl (ref 78.0–100.0)
Platelets: 250 10*3/uL (ref 150.0–400.0)
RBC: 5.56 Mil/uL (ref 4.22–5.81)
RDW: 14.3 % (ref 11.5–15.5)
WBC: 4.2 10*3/uL (ref 4.0–10.5)

## 2022-06-20 LAB — MICROALBUMIN / CREATININE URINE RATIO
Creatinine,U: 135.3 mg/dL
Microalb Creat Ratio: 0.6 mg/g (ref 0.0–30.0)
Microalb, Ur: 0.8 mg/dL (ref 0.0–1.9)

## 2022-06-20 LAB — HEMOGLOBIN A1C: Hgb A1c MFr Bld: 5.8 % (ref 4.6–6.5)

## 2022-07-02 ENCOUNTER — Other Ambulatory Visit: Payer: Self-pay | Admitting: Family Medicine

## 2022-08-17 ENCOUNTER — Encounter: Payer: Self-pay | Admitting: Family Medicine

## 2022-08-31 ENCOUNTER — Other Ambulatory Visit: Payer: Self-pay | Admitting: Family Medicine

## 2022-09-29 ENCOUNTER — Other Ambulatory Visit: Payer: Self-pay | Admitting: Family Medicine

## 2022-12-03 ENCOUNTER — Encounter: Payer: Self-pay | Admitting: Family Medicine

## 2022-12-03 MED ORDER — RYBELSUS 7 MG PO TABS: 1.0000 | ORAL_TABLET | Freq: Every day | ORAL | 2 refills | Status: DC

## 2022-12-05 ENCOUNTER — Telehealth: Payer: Self-pay | Admitting: Family Medicine

## 2022-12-05 NOTE — Telephone Encounter (Signed)
Please complete to PA for this patients Rybelsus

## 2022-12-07 ENCOUNTER — Other Ambulatory Visit: Payer: Self-pay | Admitting: Family Medicine

## 2022-12-07 ENCOUNTER — Encounter: Payer: Self-pay | Admitting: Family Medicine

## 2022-12-07 MED ORDER — PIOGLITAZONE HCL 30 MG PO TABS
30.0000 mg | ORAL_TABLET | Freq: Every day | ORAL | 0 refills | Status: DC
Start: 2022-12-07 — End: 2022-12-26

## 2022-12-10 ENCOUNTER — Other Ambulatory Visit (HOSPITAL_COMMUNITY): Payer: Self-pay

## 2022-12-10 ENCOUNTER — Telehealth: Payer: Self-pay | Admitting: Pharmacist

## 2022-12-10 NOTE — Telephone Encounter (Signed)
Pharmacy Patient Advocate Encounter   Received notification from Physician's Office that prior authorization for Rybelsus 7MG  tablets is required/requested.   Insurance verification completed.   The patient is insured through [employer] .   Per test claim: PA required; PA submitted to [employer] via CoverMyMeds Key/confirmation #/EOC [id number] Status is pending

## 2022-12-11 ENCOUNTER — Other Ambulatory Visit (HOSPITAL_COMMUNITY): Payer: Self-pay

## 2022-12-11 NOTE — Telephone Encounter (Signed)
Pharmacy Patient Advocate Encounter  Received notification from [employer] that Prior Authorization for Rybelsus 7MG  tablets has been APPROVED from 12/10/2022 to 12/10/2023. Ran test claim, Copay is $14.99. This test claim was processed through [facility]- copay amounts may vary at other pharmacies due to pharmacy/plan contracts, or as the patient moves through the different stages of their insurance plan.   PA #/Case ID/Reference #: [id number]

## 2022-12-11 NOTE — Telephone Encounter (Signed)
Called the patient informed of approval. Patient verbalized understanding.

## 2022-12-11 NOTE — Telephone Encounter (Signed)
Pharmacy Patient Advocate Encounter   Received notification from [employer] that Prior Authorization for Rybelsus 7MG  tablets has been APPROVED from 12/10/2022 to 12/10/2023. Ran test claim, Copay is $14.99. This test claim was processed through [facility]- copay amounts may vary at other pharmacies due to pharmacy/plan contracts, or as the patient moves through the different stages of their insurance plan.    PA #/Case ID/Reference #: [id number]    Called the patient informed of approval.

## 2022-12-12 ENCOUNTER — Other Ambulatory Visit (HOSPITAL_COMMUNITY): Payer: Self-pay

## 2022-12-16 ENCOUNTER — Other Ambulatory Visit: Payer: Self-pay | Admitting: Family Medicine

## 2022-12-16 DIAGNOSIS — E1169 Type 2 diabetes mellitus with other specified complication: Secondary | ICD-10-CM

## 2022-12-19 ENCOUNTER — Ambulatory Visit: Payer: No Typology Code available for payment source | Admitting: Family Medicine

## 2022-12-26 ENCOUNTER — Encounter: Payer: Self-pay | Admitting: Family Medicine

## 2022-12-26 ENCOUNTER — Ambulatory Visit: Payer: Managed Care, Other (non HMO) | Admitting: Family Medicine

## 2022-12-26 VITALS — BP 130/84 | HR 82 | Temp 98.0°F | Resp 16 | Ht 75.0 in | Wt 259.8 lb

## 2022-12-26 DIAGNOSIS — Z23 Encounter for immunization: Secondary | ICD-10-CM

## 2022-12-26 DIAGNOSIS — M7661 Achilles tendinitis, right leg: Secondary | ICD-10-CM | POA: Diagnosis not present

## 2022-12-26 DIAGNOSIS — Z794 Long term (current) use of insulin: Secondary | ICD-10-CM

## 2022-12-26 DIAGNOSIS — E1169 Type 2 diabetes mellitus with other specified complication: Secondary | ICD-10-CM | POA: Diagnosis not present

## 2022-12-26 DIAGNOSIS — I10 Essential (primary) hypertension: Secondary | ICD-10-CM

## 2022-12-26 DIAGNOSIS — Z7984 Long term (current) use of oral hypoglycemic drugs: Secondary | ICD-10-CM

## 2022-12-26 DIAGNOSIS — E669 Obesity, unspecified: Secondary | ICD-10-CM

## 2022-12-26 MED ORDER — ATORVASTATIN CALCIUM 40 MG PO TABS: 40.0000 mg | ORAL_TABLET | Freq: Every day | ORAL | 3 refills | Status: DC

## 2022-12-26 MED ORDER — PIOGLITAZONE HCL 30 MG PO TABS: 30.0000 mg | ORAL_TABLET | Freq: Every day | ORAL | 2 refills | Status: AC

## 2022-12-26 MED ORDER — RYBELSUS 7 MG PO TABS: 1.0000 | ORAL_TABLET | Freq: Every day | ORAL | 2 refills | Status: DC

## 2022-12-26 MED ORDER — METFORMIN HCL ER 500 MG PO TB24: 1000.0000 mg | ORAL_TABLET | Freq: Two times a day (BID) | ORAL | 2 refills | Status: DC

## 2022-12-26 NOTE — Progress Notes (Signed)
Subjective:   Chief Complaint  Patient presents with   Follow-up    Follow up    Alex Gray is a 57 y.o. male here for follow-up of diabetes.   Abid does not routinely check his sugars.  Patient does not require insulin.   Medications include: Actos 30 mg/d, Rybelsus 7 mg/d; failed metformin short acting as it gave him GI upset Diet is healthy.  Exercise: walking  Hypertension Patient presents for hypertension follow up. He does monitor home blood pressures. Blood pressures ranging on average from 120-130's/80's. He is compliant with medication- olmesartan 20 mg/d. Patient has these side effects of medication: none Diet/exercise as above.  No CP or SOB.   Right heel pain Over the past 2 months, the patient had intermittent heel pain.  He will go walking and feel a pop/sharp pain in his Achilles tendon.  He looks around feeling like he has been struck but nothing will be there.  He will have intermittent swelling.  No bruising or redness.  He has not tried anything at home so far.  No neurologic signs or symptoms.  Past Medical History:  Diagnosis Date   Arthritis    Carpal tunnel syndrome    Diabetes mellitus without complication (HCC)    Essential hypertension 01/18/2016   High cholesterol    Obesity 01/18/2016     Related testing: Retinal exam: Due Pneumovax: done  Objective:  BP 130/84 (BP Location: Left Arm, Patient Position: Sitting, Cuff Size: Normal)   Pulse 82   Temp 98 F (36.7 C) (Oral)   Resp 16   Ht 6\' 3"  (1.905 m)   Wt 259 lb 12.8 oz (117.8 kg)   SpO2 95%   BMI 32.47 kg/m  General:  Well developed, well nourished, in no apparent distress Lungs:  CTAB, no access msc use Cardio:  RRR, no bruits, no LE edema Musculoskeletal:  + TTP over the left Achilles tendon without edema or deformity Psych: Age appropriate judgment and insight  Assessment:   Type 2 diabetes mellitus with obesity (HCC) - Plan: Comprehensive metabolic panel, Lipid panel,  Hemoglobin A1c, atorvastatin (LIPITOR) 40 MG tablet  Essential hypertension  Achilles tendinitis of right lower extremity   Plan:   Chronic, hopefully stable.  Continue Actos 30 mg daily, Rybelsus 7 mg daily, change metformin to extended release 1000 mg twice daily.  Counseled on diet and exercise.  He is going to get his eye exam today. Chronic, stable.  Continue olmesartan 20 mg daily. Stretches and exercises given.  Avoid walking/running.  Rather he should crosstraining with cycling or using the elliptical machine.  If no improvement in the next month, would consider putting him in a cam boot and refer him to physical therapy.  Heat, ice, Tylenol. Flu shot today.  Shingrix recommended. F/u in 6 mo. The patient voiced understanding and agreement to the plan.  Jilda Roche Tony, DO 12/26/22 3:51 PM

## 2022-12-26 NOTE — Patient Instructions (Addendum)
Give [place] 2-3 business days to get the results of your labs back.   Keep the diet clean and stay active.  Ice/cold pack over area for 10-15 min twice daily.  Sed me a message if no improvement in the next month or so.   Let us know if you need anything.  Achilles Tendinitis Rehab It is normal to feel mild stretching, pulling, tightness, or discomfort as you do these exercises, but you should stop right away if you feel sudden pain or your pain gets worse.   Stretching and range of motion exercises These exercises warm up your muscles and joints and improve the movement and flexibility of your ankle. These exercises also help to relieve pain, numbness, and tingling. Exercise A: Standing wall calf stretch, knee straight  Stand with your hands against a wall. Extend your affected leg behind you and bend your front knee slightly. Keep both of your heels on the floor. Point the toes of your back foot slightly inward. Keeping your heels on the floor and your back knee straight, shift your weight toward the wall. Do not allow your back to arch. You should feel a gentle stretch in your calf. Hold this position for seconds. Repeat 2 times. Complete this stretch 3 times per week Exercise B: Standing wall calf stretch, knee bent Stand with your hands against a wall. Extend your affected leg behind you, and bend your front knee slightly. Keep both of your heels on the floor. Point the toes of your back foot slightly inward. Keeping your heels on the floor, unlock your back knee so that it is bent. You should feel a gentle stretch deep in your calf. Hold this position for 30 seconds. Repeat 2 times. Complete this stretch 3 times per week.  Strengthening exercises These exercises build strength and control of your ankle. Endurance is the ability to use your muscles for a long time, even after they get tired. Exercise C: Plantar flexion with band  Sit on the floor with your affected leg extended.  You may put a pillow under your calf to give your foot more room to move. Loop a rubber exercise band or tube around the ball of your affected foot. The ball of your foot is on the walking surface, right under your toes. The band or tube should be slightly tense when your foot is relaxed. If the band or tube slips, you can put on your shoe or put a washcloth between the band and your foot to help it stay in place. Slowly point your toes downward, pushing them away from you. Hold this position for 10 seconds. Slowly release the tension in the band or tube, controlling smoothly until your foot is back to the starting position. Repeat 2 times. Complete this exercise 3 times per week. Exercise D: Heel raise with eccentric lower  Stand on a step with the balls of your feet. The ball of your foot is on the walking surface, right under your toes. Do not put your heels on the step. For balance, rest your hands on the wall or on a railing. Rise up onto the balls of your feet. Keeping your heels up, shift all of your weight to your affected leg and pick up your other leg. Slowly lower your affected leg so your heel drops below the level of the step. Put down your foot. If told by your health care provider, build up to: 3 sets of 15 repetitions while keeping your knees straight. 3  sets of 15 repetitions while keeping your knees bent as far as told by your health care provider.  Complete this exercise 3 times per week. If this exercise is too easy, try doing it while wearing a backpack with weights in it. Balance exercises These exercises improve or maintain your balance. Balance is important in preventing falls. Exercise E: Single leg stand Without shoes, stand near a railing or in a door frame. Hold on to the railing or door frame as needed. Stand on your affected foot. Keep your big toe down on the floor and try to keep your arch lifted. Hold this position for 10 seconds. Repeat 2 times. Complete  this exercise 3 times per week. If this exercise is too easy, you can try it with your eyes closed or while standing on a pillow. Make sure you discuss any questions you have with your health care provider. Document Released: 09/06/2004 Document Revised: 10/13/2015 Document Reviewed: 10/12/2014 Elsevier Interactive Patient Education  [employer].

## 2022-12-26 NOTE — Addendum Note (Signed)
Addended by: Kathi Ludwig on: 12/26/2022 04:13 PM   Modules accepted: Orders

## 2022-12-27 LAB — COMPREHENSIVE METABOLIC PANEL
ALT: 14 U/L (ref 0–53)
AST: 19 U/L (ref 0–37)
Albumin: 4.3 g/dL (ref 3.5–5.2)
Alkaline Phosphatase: 77 U/L (ref 39–117)
BUN: 12 mg/dL (ref 6–23)
CO2: 30 meq/L (ref 19–32)
Calcium: 9.5 mg/dL (ref 8.4–10.5)
Chloride: 101 meq/L (ref 96–112)
Creatinine, Ser: 1.05 mg/dL (ref 0.40–1.50)
GFR: 79.46 mL/min (ref >60.00)
Glucose, Bld: 83 mg/dL (ref 70–99)
Potassium: 4.1 meq/L (ref 3.5–5.1)
Sodium: 139 meq/L (ref 135–145)
Total Bilirubin: 0.7 mg/dL (ref 0.2–1.2)
Total Protein: 7.4 g/dL (ref 6.0–8.3)

## 2022-12-27 LAB — LIPID PANEL
Cholesterol: 199 mg/dL (ref 0–200)
HDL: 47.2 mg/dL (ref >39.00)
LDL Cholesterol: 124 mg/dL — ABNORMAL HIGH (ref 0–99)
NonHDL: 151.3
Total CHOL/HDL Ratio: 4
Triglycerides: 138 mg/dL (ref 0.0–149.0)
VLDL: 27.6 mg/dL (ref 0.0–40.0)

## 2022-12-27 LAB — HEMOGLOBIN A1C: Hgb A1c MFr Bld: 5.9 % (ref 4.6–6.5)

## 2023-01-31 ENCOUNTER — Encounter: Payer: Self-pay | Admitting: Family Medicine

## 2023-03-01 ENCOUNTER — Other Ambulatory Visit: Payer: Self-pay

## 2023-03-01 ENCOUNTER — Encounter: Payer: Self-pay | Admitting: Family Medicine

## 2023-03-01 DIAGNOSIS — M6788 Other specified disorders of synovium and tendon, other site: Secondary | ICD-10-CM

## 2023-03-01 NOTE — Addendum Note (Signed)
 Addended by: Kathi Ludwig on: 03/01/2023 11:53 AM   Modules accepted: Orders

## 2023-03-04 ENCOUNTER — Other Ambulatory Visit: Payer: Self-pay | Admitting: Family Medicine

## 2023-03-12 ENCOUNTER — Ambulatory Visit: Payer: Managed Care, Other (non HMO) | Admitting: Family

## 2023-03-12 ENCOUNTER — Encounter: Payer: Self-pay | Admitting: Family

## 2023-03-12 DIAGNOSIS — M7661 Achilles tendinitis, right leg: Secondary | ICD-10-CM | POA: Diagnosis not present

## 2023-03-12 NOTE — Progress Notes (Unsigned)
Office Visit Note   Patient: Alex Gray           Date of Birth: [birth year]           MRN: [id number] Visit Date: 03/12/2023              Requested by: Sharlene Dory, DO [address] [place],  [place] [address] PCP: Sharlene Dory, DO  Chief Complaint  Patient presents with   Right Leg - Pain    Achilles tendon pain      HPI: The patient is a 58 year old gentleman who presents today for initial evaluation of posterior heel pain on the right.  Reports this has been ongoing for about 4 months now.  He has good days and bad days.  He reports when he does prolonged walking he does have sharp pain in the posterior heel with intermittent popping sensation.  He has not had any injuries there are no relieving factors.  Has tried ice heat as well as Tylenol some stretching as well as rest avoiding walking and running without relief of his symptoms  Assessment & Plan: Visit Diagnoses: No diagnosis found.  Plan: Achilles tendinitis right.  Given heel lifts to rest his Achilles he will use Aleve twice daily for the next 2 weeks  Follow-Up Instructions: Return in about 4 weeks (around 04/09/2023), or if symptoms worsen or fail to improve.   Ortho Exam  Patient is alert, oriented, no adenopathy, well-dressed, normal affect, normal respiratory effort. On examination right lower extremity the Achilles is without palpable cords or defects he has dorsiflexion to neutral.  There is point tenderness to the mid substance of the Achilles.  There is no edema no erythema  Imaging: No results found. No images are attached to the encounter.  Labs: Lab Results  Component Value Date   HGBA1C 5.9 12/26/2022   HGBA1C 5.8 06/19/2022   HGBA1C 6.2 09/15/2021     Lab Results  Component Value Date   ALBUMIN 4.3 12/26/2022   ALBUMIN 4.2 06/19/2022   ALBUMIN 4.2 11/29/2021    No results found for: "MG" No results found for: "VD25OH"  No results found for:  "PREALBUMIN"    Latest Ref Rng & Units 06/19/2022    3:08 PM 04/26/2021   11:01 AM 04/08/2019    5:53 PM  CBC EXTENDED  WBC 4.0 - 10.5 K/uL 4.2  3.6  5.7   RBC 4.22 - 5.81 Mil/uL 5.56  5.76  6.03   Hemoglobin 13.0 - 17.0 g/dL 19.1  47.8  29.5   HCT 39.0 - 52.0 % 45.7  46.5  49.9   Platelets 150.0 - 400.0 K/uL 250.0  233.0  239      There is no height or weight on file to calculate BMI.  Orders:  No orders of the defined types were placed in this encounter.  No orders of the defined types were placed in this encounter.    Procedures: No procedures performed  Clinical Data: No additional findings.  ROS:  All other systems negative, except as noted in the HPI. Review of Systems  Objective: Vital Signs: There were no vitals taken for this visit.  Specialty Comments:  No specialty comments available.  PMFS History: Patient Active Problem List   Diagnosis Date Noted   Type 2 diabetes mellitus with obesity (HCC) 09/26/2017   Gastroesophageal reflux disease 04/10/2017   Essential hypertension 01/18/2016   Obesity 01/18/2016   Past Medical History:  Diagnosis  Date   Arthritis    Carpal tunnel syndrome    Diabetes mellitus without complication (HCC)    Essential hypertension 01/18/2016   High cholesterol    Obesity 01/18/2016    Family History  Problem Relation Age of Onset   Diabetes Mother    Hypertension Father    Diabetes Father    Colon polyps Brother    Colon cancer Brother    Esophageal cancer Neg Hx    Stomach cancer Neg Hx    Rectal cancer Neg Hx     Past Surgical History:  Procedure Laterality Date   COLONOSCOPY     KNEE ARTHROSCOPY     Social History   Occupational History   Not on file  Tobacco Use   Smoking status: Never   Smokeless tobacco: Never  Vaping Use   Vaping status: Never Used  Substance and Sexual Activity   Alcohol use: No   Drug use: No   Sexual activity: Not on file

## 2023-04-01 ENCOUNTER — Telehealth: Payer: Self-pay | Admitting: Family

## 2023-04-01 NOTE — Telephone Encounter (Signed)
 Called [name] and left vm for pt to r/s with PA Erin.

## 2023-04-02 ENCOUNTER — Ambulatory Visit: Payer: Managed Care, Other (non HMO) | Admitting: Family

## 2023-06-04 ENCOUNTER — Other Ambulatory Visit: Payer: Self-pay | Admitting: Family Medicine

## 2023-06-26 ENCOUNTER — Encounter: Payer: Managed Care, Other (non HMO) | Admitting: Family Medicine

## 2023-07-03 ENCOUNTER — Encounter: Admitting: Family Medicine

## 2023-07-10 ENCOUNTER — Ambulatory Visit: Payer: Self-pay | Admitting: Family Medicine

## 2023-07-10 ENCOUNTER — Encounter: Payer: Self-pay | Admitting: Family Medicine

## 2023-07-10 ENCOUNTER — Ambulatory Visit (INDEPENDENT_AMBULATORY_CARE_PROVIDER_SITE_OTHER): Admitting: Family Medicine

## 2023-07-10 ENCOUNTER — Other Ambulatory Visit: Payer: Self-pay

## 2023-07-10 VITALS — BP 132/80 | HR 74 | Temp 98.0°F | Resp 16 | Ht 75.0 in | Wt 257.6 lb

## 2023-07-10 DIAGNOSIS — E1169 Type 2 diabetes mellitus with other specified complication: Secondary | ICD-10-CM

## 2023-07-10 DIAGNOSIS — Z0000 Encounter for general adult medical examination without abnormal findings: Secondary | ICD-10-CM

## 2023-07-10 DIAGNOSIS — Z125 Encounter for screening for malignant neoplasm of prostate: Secondary | ICD-10-CM | POA: Diagnosis not present

## 2023-07-10 DIAGNOSIS — M25562 Pain in left knee: Secondary | ICD-10-CM

## 2023-07-10 DIAGNOSIS — E7800 Pure hypercholesterolemia, unspecified: Secondary | ICD-10-CM

## 2023-07-10 DIAGNOSIS — Z23 Encounter for immunization: Secondary | ICD-10-CM

## 2023-07-10 DIAGNOSIS — E669 Obesity, unspecified: Secondary | ICD-10-CM

## 2023-07-10 LAB — COMPREHENSIVE METABOLIC PANEL WITH GFR
ALT: 11 U/L (ref 0–53)
AST: 17 U/L (ref 0–37)
Albumin: 4.4 g/dL (ref 3.5–5.2)
Alkaline Phosphatase: 74 U/L (ref 39–117)
BUN: 16 mg/dL (ref 6–23)
CO2: 32 meq/L (ref 19–32)
Calcium: 9.2 mg/dL (ref 8.4–10.5)
Chloride: 103 meq/L (ref 96–112)
Creatinine, Ser: 0.92 mg/dL (ref 0.40–1.50)
GFR: 92.77 mL/min (ref >60.00)
Glucose, Bld: 85 mg/dL (ref 70–99)
Potassium: 4.2 meq/L (ref 3.5–5.1)
Sodium: 140 meq/L (ref 135–145)
Total Bilirubin: 0.7 mg/dL (ref 0.2–1.2)
Total Protein: 7 g/dL (ref 6.0–8.3)

## 2023-07-10 LAB — LIPID PANEL
Cholesterol: 219 mg/dL — ABNORMAL HIGH (ref 0–200)
HDL: 47.6 mg/dL (ref >39.00)
LDL Cholesterol: 156 mg/dL — ABNORMAL HIGH (ref 0–99)
NonHDL: 171.25
Total CHOL/HDL Ratio: 5
Triglycerides: 78 mg/dL (ref 0.0–149.0)
VLDL: 15.6 mg/dL (ref 0.0–40.0)

## 2023-07-10 LAB — CBC
HCT: 46.6 % (ref 39.0–52.0)
Hemoglobin: 14.9 g/dL (ref 13.0–17.0)
MCHC: 32 g/dL (ref 30.0–36.0)
MCV: 81.7 fl (ref 78.0–100.0)
Platelets: 226 10*3/uL (ref 150.0–400.0)
RBC: 5.7 Mil/uL (ref 4.22–5.81)
RDW: 13.9 % (ref 11.5–15.5)
WBC: 3.4 10*3/uL — ABNORMAL LOW (ref 4.0–10.5)

## 2023-07-10 LAB — MICROALBUMIN / CREATININE URINE RATIO
Creatinine,U: 160.6 mg/dL
Microalb Creat Ratio: 6.1 mg/g (ref 0.0–30.0)
Microalb, Ur: 1 mg/dL (ref 0.0–1.9)

## 2023-07-10 LAB — HEMOGLOBIN A1C: Hgb A1c MFr Bld: 5.8 % (ref 4.6–6.5)

## 2023-07-10 LAB — PSA: PSA: 1.42 ng/mL (ref 0.10–4.00)

## 2023-07-10 NOTE — Addendum Note (Signed)
 Addended by: Capri Veals M on: 07/10/2023 11:20 AM   Modules accepted: Orders

## 2023-07-10 NOTE — Patient Instructions (Addendum)
 Give us  2-3 business days to get the results of your labs back.   Keep the diet clean and stay active.  Please get me a copy of your advanced directive form at your convenience.   The Shingrix vaccine (for shingles) is a 2 shot series spaced 2-6 months apart. It can make people feel low energy, achy and almost like they have the flu for 48 hours after injection. 1/5 people can have nausea and/or vomiting. Please plan accordingly when deciding on when to get this shot. Call our office for a nurse visit appointment to get this. The second shot of the series is less severe regarding the side effects, but it still lasts 48 hours.   Ice/cold pack over area for 10-15 min twice daily.  OK to take Tylenol  1000 mg (2 extra strength tabs) or 975 mg (3 regular strength tabs) every 6 hours as needed.  Ibuprofen  400-600 mg (2-3 over the counter strength tabs) every 6 hours as needed for pain.  Send me a message in 3-4 weeks if not significantly better with your knee.   Let us  know if you need anything.  Knee Exercises It is normal to feel mild stretching, pulling, tightness, or discomfort as you do these exercises, but you should stop right away if you feel sudden pain or your pain gets worse.  STRETCHING AND RANGE OF MOTION EXERCISES  These exercises warm up your muscles and joints and improve the movement and flexibility of your knee. These exercises also help to relieve pain, numbness, and tingling. Exercise A: Knee Extension, Prone  Lie on your abdomen on a bed. Place your left / right knee just beyond the edge of the surface so your knee is not on the bed. You can put a towel under your left / right thigh just above your knee for comfort. Relax your leg muscles and allow gravity to straighten your knee. You should feel a stretch behind your left / right knee. Hold this position for 30 seconds. Scoot up so your knee is supported between repetitions. Repeat 2 times. Complete this stretch 3 times  per week. Exercise B: Knee Flexion, Active     Lie on your back with both knees straight. If this causes back discomfort, bend your left / right knee so your foot is flat on the floor. Slowly slide your left / right heel back toward your buttocks until you feel a gentle stretch in the front of your knee or thigh. Hold this position for 30 seconds. Slowly slide your left / right heel back to the starting position. Repeat 2 times. Complete this exercise 3 times per week. Exercise C: Quadriceps, Prone     Lie on your abdomen on a firm surface, such as a bed or padded floor. Bend your left / right knee and hold your ankle. If you cannot reach your ankle or pant leg, loop a belt around your foot and grab the belt instead. Gently pull your heel toward your buttocks. Your knee should not slide out to the side. You should feel a stretch in the front of your thigh and knee. Hold this position for 30 seconds. Repeat 2 times. Complete this stretch 3 times per week. Exercise D: Hamstring, Supine  Lie on your back. Loop a belt or towel over the ball of your left / right foot. The ball of your foot is on the walking surface, right under your toes. Straighten your left / right knee and slowly pull on the belt  to raise your leg until you feel a gentle stretch behind your knee. Do not let your left / right knee bend while you do this. Keep your other leg flat on the floor. Hold this position for 30 seconds. Repeat 2 times. Complete this stretch 3 times per week. STRENGTHENING EXERCISES  These exercises build strength and endurance in your knee. Endurance is the ability to use your muscles for a long time, even after they get tired. Exercise E: Quadriceps, Isometric     Lie on your back with your left / right leg extended and your other knee bent. Put a rolled towel or small pillow under your knee if told by your health care provider. Slowly tense the muscles in the front of your left / right thigh. You  should see your kneecap slide up toward your hip or see increased dimpling just above the knee. This motion will push the back of the knee toward the floor. For 3 seconds, keep the muscle as tight as you can without increasing your pain. Relax the muscles slowly and completely. Repeat for 10 total reps Repeat 2 ti mes. Complete this exercise 3 times per week. Exercise F: Straight Leg Raises - Quadriceps  Lie on your back with your left / right leg extended and your other knee bent. Tense the muscles in the front of your left / right thigh. You should see your kneecap slide up or see increased dimpling just above the knee. Your thigh may even shake a bit. Keep these muscles tight as you raise your leg 4-6 inches (10-15 cm) off the floor. Do not let your knee bend. Hold this position for 3 seconds. Keep these muscles tense as you lower your leg. Relax your muscles slowly and completely after each repetition. 10 total reps. Repeat 2 times. Complete this exercise 3 times per week.  Exercise G: Hamstring Curls     If told by your health care provider, do this exercise while wearing ankle weights. Begin with 5 lb weights (optional). Then increase the weight by 1 lb (0.5 kg) increments. Do not wear ankle weights that are more than 20 lbs to start with. Lie on your abdomen with your legs straight. Bend your left / right knee as far as you can without feeling pain. Keep your hips flat against the floor. Hold this position for 3 seconds. Slowly lower your leg to the starting position. Repeat for 10 reps.  Repeat 2 times. Complete this exercise 3 times per week. Exercise H: Squats (Quadriceps)  Stand in front of a table, with your feet and knees pointing straight ahead. You may rest your hands on the table for balance but not for support. Slowly bend your knees and lower your hips like you are going to sit in a chair. Keep your weight over your heels, not over your toes. Keep your lower legs upright  so they are parallel with the table legs. Do not let your hips go lower than your knees. Do not bend lower than told by your health care provider. If your knee pain increases, do not bend as low. Hold the squat position for 1 second. Slowly push with your legs to return to standing. Do not use your hands to pull yourself to standing. Repeat 2 times. Complete this exercise 3 times per week. Exercise I: Wall Slides (Quadriceps)     Lean your back against a smooth wall or door while you walk your feet out 18-24 inches (46-61 cm) from it.  Place your feet hip-width apart. Slowly slide down the wall or door until your knees Repeat 2 times. Complete this exercise every other day. Exercise K: Straight Leg Raises - Hip Abductors  Lie on your side with your left / right leg in the top position. Lie so your head, shoulder, knee, and hip line up. You may bend your bottom knee to help you keep your balance. Roll your hips slightly forward so your hips are stacked directly over each other and your left / right knee is facing forward. Leading with your heel, lift your top leg 4-6 inches (10-15 cm). You should feel the muscles in your outer hip lifting. Do not let your foot drift forward. Do not let your knee roll toward the ceiling. Hold this position for 3 seconds. Slowly return your leg to the starting position. Let your muscles relax completely after each repetition. 10 total reps. Repeat 2 times. Complete this exercise 3 times per week. Exercise J: Straight Leg Raises - Hip Extensors  Lie on your abdomen on a firm surface. You can put a pillow under your hips if that is more comfortable. Tense the muscles in your buttocks and lift your left / right leg about 4-6 inches (10-15 cm). Keep your knee straight as you lift your leg. Hold this position for 3 seconds. Slowly lower your leg to the starting position. Let your leg relax completely after each repetition. Repeat 2 times. Complete this exercise 3  times per week. Document Released: 12/20/2004 Document Revised: 10/31/2015 Document Reviewed: 12/12/2014 Elsevier Interactive Patient Education  2017 [employer].

## 2023-07-10 NOTE — Progress Notes (Signed)
 Chief Complaint  Patient presents with   Annual Exam    CPE    Well Male Alex Gray is here for a complete physical.   His last physical was >1 year ago.  Current diet: in general, a "healthy" diet.  Current exercise: walking Weight trend: stable Fatigue out of ordinary? No. Seat belt? Yes.   Advanced directive? No  Health maintenance Shingrix- No Colonoscopy- Yes Tetanus- Yes HIV- Yes Hep C- Yes  L medial knee pain for 1 mo. No inj or change in activity. Sharp pain. No radiation. Getting worse. Feels like his leg will give out. No bruising, redness, swelling. Tried icing it with some relief. Tylenol  prn. No catching/locking or decreased ROM. Notices is more when he is walking.    Past Medical History:  Diagnosis Date   Arthritis    Carpal tunnel syndrome    Diabetes mellitus without complication (HCC)    Essential hypertension 01/18/2016   High cholesterol    Obesity 01/18/2016      Past Surgical History:  Procedure Laterality Date   COLONOSCOPY     KNEE ARTHROSCOPY      Medications  Current Outpatient Medications on File Prior to Visit  Medication Sig Dispense Refill   atorvastatin  (LIPITOR) 40 MG tablet Take 1 tablet (40 mg total) by mouth daily. 90 tablet 3   metFORMIN  (GLUCOPHAGE -XR) 500 MG 24 hr tablet Take 2 tablets (1,000 mg total) by mouth 2 (two) times daily with a meal. 360 tablet 2   olmesartan  (BENICAR ) 20 MG tablet TAKE 1 TABLET BY MOUTH EVERY DAY 90 tablet 0   pioglitazone  (ACTOS ) 30 MG tablet Take 1 tablet (30 mg total) by mouth daily. 90 tablet 2   Semaglutide  (RYBELSUS ) 7 MG TABS Take 1 tablet (7 mg total) by mouth daily. 90 tablet 2    Allergies No Known Allergies  Family History Family History  Problem Relation Age of Onset   Diabetes Mother    Hypertension Father    Diabetes Father    Colon polyps Brother    Colon cancer Brother    Esophageal cancer Neg Hx    Stomach cancer Neg Hx    Rectal cancer Neg Hx     Review of  Systems: Constitutional:  no fevers Eye:  no recent significant change in vision Ear/Nose/Mouth/Throat:  Ears:  no hearing loss Nose/Mouth/Throat:  no complaints of nasal congestion, no sore throat Cardiovascular:  no chest pain Respiratory:  no shortness of breath Gastrointestinal:  no change in bowel habits GU:  Male: negative for dysuria, frequency Musculoskeletal/Extremities:  +L knee pain Integumentary (Skin/Breast):  no abnormal skin lesions reported Neurologic:  no headaches Endocrine: No unexpected weight changes Hematologic/Lymphatic:  no abnormal bleeding  Exam BP 132/80 (BP Location: Left Arm, Patient Position: Sitting)   Pulse 74   Temp 98 F (36.7 C) (Oral)   Resp 16   Ht 6\' 3"  (1.905 m)   Wt 257 lb 9.6 oz (116.8 kg)   SpO2 97%   BMI 32.20 kg/m  General:  well developed, well nourished, in no apparent distress Skin:  no significant moles, warts, or growths Head:  no masses, lesions, or tenderness Eyes:  pupils equal and round, sclera anicteric without injection Ears:  canals without lesions, TMs shiny without retraction, no obvious effusion, no erythema Nose:  nares patent, mucosa normal Throat/Pharynx:  lips and gingiva without lesion; tongue and uvula midline; non-inflamed pharynx; no exudates or postnasal drainage Neck: neck supple without adenopathy, thyromegaly, or masses  Cardiac: RRR, no bruits, no LE edema Lungs:  clear to auscultation, breath sounds equal bilaterally, no respiratory distress Abdomen: BS+, soft, non-tender, non-distended, no masses or organomegaly noted Rectal: Deferred Musculoskeletal:  symmetrical muscle groups noted without atrophy or deformity Neuro:  gait normal; deep tendon reflexes normal and symmetric; sensation intact to pinprick over feet b/l Psych: well oriented with normal range of affect and appropriate judgment/insight  Assessment and Plan  Well adult exam - Plan: CBC, Comprehensive metabolic panel with GFR, Lipid  panel  Type 2 diabetes mellitus with obesity (HCC) - Plan: Microalbumin / creatinine urine ratio, Hemoglobin A1c  Screening for prostate cancer - Plan: PSA  Acute pain of left knee   Well 58 y.o. male. Counseled on diet and exercise. Counseled on risks and benefits of prostate cancer screening with PSA. The patient agrees to undergo testing. L knee pain: Acute issue that is worsening.  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  Stretches and exercises, ice, Tylenol , ibuprofen .  Consider trigger point injection versus referral if no improvement in next 3 to 4 weeks.  He will message us  if not significantly improved. PCV20 today. Shingrix rec'd.  Immunizations, labs, and further orders as above. Follow up in 6 mo. The patient voiced understanding and agreement to the plan.  Shellie Dials [place], DO 07/10/23 10:55 AM

## 2023-08-19 ENCOUNTER — Other Ambulatory Visit (INDEPENDENT_AMBULATORY_CARE_PROVIDER_SITE_OTHER)

## 2023-08-19 ENCOUNTER — Ambulatory Visit: Payer: Self-pay | Admitting: Family Medicine

## 2023-08-19 DIAGNOSIS — Z125 Encounter for screening for malignant neoplasm of prostate: Secondary | ICD-10-CM

## 2023-08-19 DIAGNOSIS — R972 Elevated prostate specific antigen [PSA]: Secondary | ICD-10-CM

## 2023-08-19 DIAGNOSIS — E7800 Pure hypercholesterolemia, unspecified: Secondary | ICD-10-CM

## 2023-08-19 LAB — LIPID PANEL
Cholesterol: 144 mg/dL (ref 0–200)
HDL: 43.1 mg/dL (ref >39.00)
LDL Cholesterol: 86 mg/dL (ref 0–99)
NonHDL: 100.75
Total CHOL/HDL Ratio: 3
Triglycerides: 74 mg/dL (ref 0.0–149.0)
VLDL: 14.8 mg/dL (ref 0.0–40.0)

## 2023-08-19 LAB — PSA: PSA: 1.44 ng/mL (ref 0.10–4.00)

## 2023-08-26 ENCOUNTER — Encounter: Payer: Self-pay | Admitting: Family Medicine

## 2023-08-30 ENCOUNTER — Other Ambulatory Visit: Payer: Self-pay | Admitting: Family Medicine

## 2023-09-11 NOTE — Progress Notes (Signed)
 Chief Complaint: Elevated PSA   History of Present Illness:  Alex Gray is a 58 y.o. male who is seen in consultation from [place], DO for evaluation of elevating PSA trend.  PSA data: 01/2020--0.64 04/2021--0.48 05/2022--0.71 07/10/2023--1.42 08/19/2023--- 1.44  He thinks there may be a family history of prostate cancer-he thinks his dad was diagnosed with this, but his dad died of cardiac abnormalities.  No significant LUTS--IPSS 3/0.  No prior urologic history. Past Medical History:  Past Medical History:  Diagnosis Date   Arthritis    Carpal tunnel syndrome    Diabetes mellitus without complication (HCC)    Essential hypertension 01/18/2016   High cholesterol    Obesity 01/18/2016    Past Surgical History:  Past Surgical History:  Procedure Laterality Date   COLONOSCOPY     KNEE ARTHROSCOPY      Allergies:  No Known Allergies  Family History:  Family History  Problem Relation Age of Onset   Diabetes Mother    Hypertension Father    Diabetes Father    Colon polyps Brother    Colon cancer Brother    Esophageal cancer Neg Hx    Stomach cancer Neg Hx    Rectal cancer Neg Hx     Social History:  Social History   Tobacco Use   Smoking status: Never   Smokeless tobacco: Never  Vaping Use   Vaping status: Never Used  Substance Use Topics   Alcohol use: No   Drug use: No    Review of symptoms:  Constitutional:  Negative for unexplained weight loss, night sweats, fever, chills ENT:  Negative for nose bleeds, sinus pain, painful swallowing CV:  Negative for chest pain, shortness of breath, exercise intolerance, palpitations, loss of consciousness Resp:  Negative for cough, wheezing, shortness of breath GI:  Negative for nausea, vomiting, diarrhea, bloody stools GU:  Positives noted in HPI; otherwise negative for gross hematuria, dysuria, urinary incontinence Neuro:  Negative for seizures, poor balance, limb weakness, slurred  speech Psych:  Negative for lack of energy, depression, anxiety Endocrine:  Negative for polydipsia, polyuria, symptoms of hypoglycemia (dizziness, hunger, sweating) Hematologic:  Negative for anemia, purpura, petechia, prolonged or excessive bleeding, use of anticoagulants  Allergic:  Negative for difficulty breathing or choking as a result of exposure to anything; no shellfish allergy; no allergic response (rash/itch) to materials, foods  Physical exam: There were no vitals taken for this visit. GENERAL APPEARANCE:  Well appearing, well developed, well nourished, NAD HEENT: Atraumatic, Normocephalic. NECK: Normal appearance LUNGS: Normal inspiratory and expiratory excursion HEART: Regular Rate ABDOMEN: No inguinal hernias appreciated. GU: Phallus normal, no lesions. Scrotal skin normal. Testicles/epididymal structures normal. Meatus normal. Normal anal sphincter tone, prostate exam performed.  Only the apex palpable due to elongated anal canal.  No nodularity. EXTREMITIES: Moves all extremities well.  Without clubbing, cyanosis, or edema. NEUROLOGIC:  Alert and oriented x 3, normal gait, CN II-XII grossly intact.  MENTAL STATUS:  Appropriate. SKIN:  Warm, dry and intact.    Results:  I have reviewed referring/prior physicians notes  I have reviewed urinalysis--clear  I have reviewed PSA results  I have reviewed prior imaging--bladder scan revealed residual volume of 3 mL  I have reviewed most recent CMP, CBC results  IPSS results reviewed   Assessment: Elevating PSA trend.  Currently, PSA 1.44 in a 58 year old African-American male.  Incomplete prostate exam, but what is palpable is benign   Plan: I did discuss with him his  PSA trajectory.  At this point, doubling time is about 4 years, not terribly worrisome  I do think it is worthwhile for now want to keep a check on his PSA every 6 months.  I will have him drop in for blood test only in 6 months, then follow-up in a  year with a repeat PSA

## 2023-09-12 ENCOUNTER — Ambulatory Visit: Admitting: Family Medicine

## 2023-09-16 ENCOUNTER — Ambulatory Visit: Admitting: Urology

## 2023-09-16 VITALS — BP 128/86 | HR 109 | Ht 75.0 in | Wt 250.0 lb

## 2023-09-16 DIAGNOSIS — R972 Elevated prostate specific antigen [PSA]: Secondary | ICD-10-CM | POA: Diagnosis not present

## 2023-09-16 LAB — URINALYSIS, ROUTINE W REFLEX MICROSCOPIC
Bilirubin, UA: NEGATIVE
Glucose, UA: NEGATIVE
Ketones, UA: NEGATIVE
Leukocytes,UA: NEGATIVE
Nitrite, UA: NEGATIVE
Protein,UA: NEGATIVE
Specific Gravity, UA: 1.025 (ref 1.005–1.030)
Urobilinogen, Ur: 0.2 mg/dL (ref 0.2–1.0)
pH, UA: 5.5 (ref 5.0–7.5)

## 2023-09-16 LAB — MICROSCOPIC EXAMINATION

## 2023-09-16 LAB — BLADDER SCAN AMB NON-IMAGING: Scan Result: 3

## 2023-11-28 ENCOUNTER — Telehealth: Payer: Self-pay

## 2023-11-28 ENCOUNTER — Other Ambulatory Visit (HOSPITAL_COMMUNITY): Payer: Self-pay

## 2023-11-28 NOTE — Telephone Encounter (Signed)
 Pharmacy Patient Advocate Encounter   Received notification from Cover[employer]eds that prior authorization for Rybelsus  7MG  tablets  IS DUE FOR RENEWAL expires 12/10/2023.   Insurance verification completed.   The patient is insured through [employer].  Action: PA required; PA submitted to above mentioned insurance via Latent Key/confirmation #/EOC [id number] Status is pending

## 2023-11-29 ENCOUNTER — Other Ambulatory Visit (HOSPITAL_COMMUNITY): Payer: Self-pay

## 2023-11-29 ENCOUNTER — Other Ambulatory Visit: Payer: Self-pay | Admitting: Family Medicine

## 2023-11-29 NOTE — Telephone Encounter (Signed)
 Pharmacy Patient Advocate Encounter  Received notification from [employer] that Prior Authorization for Rybelsus  7MG  tablets  has been PLAN WILL NOT ALLOW EARLY RENEWAL PT STILL HAVE 90 DAYS SUPPLY AVAILABLE PER PLAN  PLEASE BE ADVISED NO PA IS NEEDED AT THIS TIME   PA #/Case ID/Reference #: [id number]

## 2023-12-06 ENCOUNTER — Other Ambulatory Visit: Payer: Self-pay | Admitting: Medical Genetics

## 2023-12-23 ENCOUNTER — Encounter: Payer: Self-pay | Admitting: Radiology

## 2024-01-01 ENCOUNTER — Other Ambulatory Visit: Payer: Self-pay | Admitting: Family Medicine

## 2024-01-01 DIAGNOSIS — E669 Obesity, unspecified: Secondary | ICD-10-CM

## 2024-01-08 ENCOUNTER — Ambulatory Visit: Admitting: Family Medicine

## 2024-01-13 ENCOUNTER — Other Ambulatory Visit: Payer: Self-pay | Admitting: Family Medicine

## 2024-01-13 ENCOUNTER — Ambulatory Visit: Payer: Self-pay | Admitting: Family Medicine

## 2024-01-13 ENCOUNTER — Encounter: Payer: Self-pay | Admitting: Family Medicine

## 2024-01-13 ENCOUNTER — Ambulatory Visit: Admitting: Family Medicine

## 2024-01-13 VITALS — BP 118/80 | HR 99 | Temp 98.0°F | Resp 16 | Ht 75.0 in | Wt 264.4 lb

## 2024-01-13 DIAGNOSIS — E7800 Pure hypercholesterolemia, unspecified: Secondary | ICD-10-CM | POA: Diagnosis not present

## 2024-01-13 DIAGNOSIS — I10 Essential (primary) hypertension: Secondary | ICD-10-CM | POA: Diagnosis not present

## 2024-01-13 DIAGNOSIS — Z23 Encounter for immunization: Secondary | ICD-10-CM | POA: Diagnosis not present

## 2024-01-13 DIAGNOSIS — M25562 Pain in left knee: Secondary | ICD-10-CM | POA: Diagnosis not present

## 2024-01-13 DIAGNOSIS — E669 Obesity, unspecified: Secondary | ICD-10-CM | POA: Diagnosis not present

## 2024-01-13 DIAGNOSIS — E119 Type 2 diabetes mellitus without complications: Secondary | ICD-10-CM | POA: Diagnosis not present

## 2024-01-13 DIAGNOSIS — Z7984 Long term (current) use of oral hypoglycemic drugs: Secondary | ICD-10-CM

## 2024-01-13 LAB — HEMOGLOBIN A1C: Hgb A1c MFr Bld: 5.9 % (ref 4.6–6.5)

## 2024-01-13 LAB — LIPID PANEL
Cholesterol: 151 mg/dL (ref 0–200)
HDL: 44.6 mg/dL (ref >39.00)
LDL Cholesterol: 90 mg/dL (ref 0–99)
NonHDL: 106.08
Total CHOL/HDL Ratio: 3
Triglycerides: 82 mg/dL (ref 0.0–149.0)
VLDL: 16.4 mg/dL (ref 0.0–40.0)

## 2024-01-13 LAB — COMPREHENSIVE METABOLIC PANEL WITH GFR
ALT: 14 U/L (ref 0–53)
AST: 17 U/L (ref 0–37)
Albumin: 4.2 g/dL (ref 3.5–5.2)
Alkaline Phosphatase: 78 U/L (ref 39–117)
BUN: 12 mg/dL (ref 6–23)
CO2: 32 meq/L (ref 19–32)
Calcium: 9.4 mg/dL (ref 8.4–10.5)
Chloride: 103 meq/L (ref 96–112)
Creatinine, Ser: 1.02 mg/dL (ref 0.40–1.50)
GFR: 81.67 mL/min (ref >60.00)
Glucose, Bld: 113 mg/dL — ABNORMAL HIGH (ref 70–99)
Potassium: 4.4 meq/L (ref 3.5–5.1)
Sodium: 139 meq/L (ref 135–145)
Total Bilirubin: 0.8 mg/dL (ref 0.2–1.2)
Total Protein: 6.9 g/dL (ref 6.0–8.3)

## 2024-01-13 MED ORDER — METFORMIN HCL ER 500 MG PO TB24: 1000.0000 mg | ORAL_TABLET | Freq: Two times a day (BID) | ORAL | 3 refills | Status: AC

## 2024-01-13 NOTE — Patient Instructions (Addendum)
 Give us  2-3 business days to get the results of your labs back.   Keep the diet clean and stay active.  Ice/cold pack over area for 10-15 min twice daily.  Please get your X-ray done at the [facility] in [place]: [address] [place], [place] [address] [phone]  You do not need an appointment for this location.   Let us  know if you need anything.  Stretching and range of motion exercises These exercises warm up your muscles and joints and improve the movement and flexibility of your knee. These exercises also help to relieve pain and stiffness.  Exercise A: Knee flexion, active Lie on your back with both knees straight. If this causes back discomfort, bend your uninjured knee so your foot is flat on the floor. Slowly slide your left / right heel back toward your buttocks until you feel a gentle stretch in the front of your knee or thigh. Stop if you have pain. Hold for3 seconds. Slowly slide your left / right heel back to the starting position. 10 total repetitions. Repeat 2 times. Complete this exercise 3 times a week.  Exercise B: Knee extension, sitting Sit with your left / right heel propped on a chair, a coffee table, or a footstool. Do not have anything under your knee to support it. Allow your leg muscles to relax, letting gravity straighten out your knee. You should feel a stretch behind your left / right knee. If told by your health care provide just above your kneecap. Hold this position for 3 seconds. Repeat for a total of 10 repetitions. Repeat 2 times. Complete this stretch 3 times a week.  Strengthening exercises These exercises build strength and endurance in your knee. Endurance is the ability to use your muscles for a long time, even after they get tired.  Exercise C: Quadriceps, isometric Lie on your back with your left / right leg extended and your other knee bent. Put a rolled towel or small pillow under your right/left knee if told by your  health care provider. Slowly tense the muscles in the front of your left / right thigh by pushing the back of your knee down. You should see your knee cap slide up toward your hip or see increased dimpling just above the knee. For 3 seconds, keep the muscle as tight as you can without increasing your pain. Relax the muscles slowly and completely. Repeat for 10 total repetitions. Repeat 2 times. Complete this exercise 3 times a week. Exercise D: Straight leg raises (quadriceps) Lie on your back with your left / right leg extended and your other knee bent. Tense the muscles in the front of your left / right thigh. You should see your kneecap slide up or see increased dimpling just above the knee. Keep these muscles tight as you raise your leg 4-6 inches (10-15 cm) off the floor. Hold this position for 3 seconds. Keep these muscles tense as you lower your leg. Relax the muscles slowly and completely. Repeat for a total of 10 repetitions. Repeat 2 times. Complete this exercise 3 times a week.  Exercise E: Hamstring curls On the floor or a bed, lie on your abdomen with your legs straight. Put a folded towel or small pillow under your left / right thigh, just above your kneecap. Slowly bend your left / right knee as far as you can without pain. Keep your hips flat against the floor or bed. Hold this position for 3 seconds. Slowly lower your  leg to the starting position. Repeat for a total of 10 repetitions. Repeat 2 times. Complete this exercise 3 times per week.  Stretching exercises These exercises warm up your muscles and joints and improve the movement and flexibility of your knee. These exercises also help to relieve pain and stiffness.  Exercise A: Quadriceps, prone Lie on your abdomen on a firm surface, such as a bed or padded floor. Bend your left / right knee and hold your ankle. If you cannot reach your ankle or pant leg, loop a belt around your foot and grab the belt instead. Gently  pull your heel toward your buttocks. Your knee should not slide out to the side. You should feel a stretch in the front of your thigh and knee. Hold this position for 30 seconds. Repeat 2 times. Complete this stretch 3 times a week.  Exercise B: Hamstring, doorway Lie on your back in front of a doorway with your left / right leg resting against the wall and your other leg flat on the floor in the doorway. There should be a slight bend in your left / right knee. Straighten your left / right knee. You should feel a stretch behind your knee or thigh. If you do not feel that stretch, scoot your buttocks closer to the door. Hold this position for 30 seconds. Repeat 2 times. Complete this stretch 3 times a week.  Strengthening exercises These exercises build strength and endurance in your knee and leg muscles. Endurance is the ability to use your muscles for a long time, even after they get tired.   Exercise D: Wall slides (quadriceps) Lean your back against a smooth wall or door, and walk your feet out 18-24 inches (45-61 cm) from it. Place your feet hip-width apart. Slowly slide down the wall or door until your knees bend 90 degrees. Keep your knees over your heels, not over your toes. Keep your knees in line with your hips. Hold for 2 seconds. Stand up to rest for 60 seconds. Repeat 2 times. Complete this exercise 3 times a week.  Exercise E: Bridge (hip extensors) Lie on your back on a firm surface with your knees bent and your feet flat on the floor. Tighten your buttocks muscles and lift your bottom off the floor until your trunk is level with your thighs. Do not arch your back. You should feel the muscles working in your buttocks and the back of your thighs. Hold this position for 2 seconds. Slowly lower your hips to the starting position. Let your buttocks muscles relax completely between repetitions. Repeat 2 times. Complete this exercise 3 times a week.

## 2024-01-13 NOTE — Progress Notes (Signed)
 Subjective:   Chief Complaint  Patient presents with   Follow-up    Follow Up 6 Months    Alex Gray is a 58 y.o. male here for follow-up of diabetes.   Pt does not routinely check her sugars.  Patient does not require insulin.   Medications include: Metformin  XR 1000 mg bid, Actos  30 mg/d, Rybelsus  7 mg/d Diet is healthy.  Exercise: walking  Hypertension Patient presents for hypertension follow up. He does monitor home blood pressures. Blood pressures ranging on average from 120's/70-80's. He is compliant with medication- olmesartan  20 mg/d. Patient has these side effects of medication: none Diet/exercise as above.  No Cp or SOB.   Hyperlipidemia Patient presents for hyperlipidemia follow up. Currently being treated with Lipitor 40 mg/d and compliance with treatment thus far has been good. He denies myalgias. Diet/exercise as above. No CP or SOB.  The patient is not known to have coexisting coronary artery disease.  Left knee pain Burning pain over the inside of his left knee over the past several months.  No injury or change in activity.  No bruising, redness, or catching/locking.  He will sometimes have swelling after he walks.  No known history of arthritis.  Past Medical History:  Diagnosis Date   Arthritis    Carpal tunnel syndrome    Diabetes mellitus without complication (HCC)    Essential hypertension 01/18/2016   High cholesterol    Obesity 01/18/2016     Related testing: Retinal exam: Done Pneumovax: done  Objective:  BP 118/80 (BP Location: Left Arm, Patient Position: Standing;Sitting)   Pulse 99   Temp 98 F (36.7 C) (Oral)   Resp 16   Ht 6' 3 (1.905 m)   Wt 264 lb 6.4 oz (119.9 kg)   SpO2 95%   BMI 33.05 kg/m  General:  Well developed, well nourished, in no apparent distress Lungs:  CTAB, no access msc use Cardio:  RRR, no bruits, no LE edema Knee: Normal active and passive range of motion.  TTP over the plica on the medial femoral  condyle; no deformity or edema/effusion Neuro: Gait is normal. Psych: Age appropriate judgment and insight  Assessment:   Type 2 diabetes mellitus in patient with obesity (HCC) - Plan: Comprehensive metabolic panel with GFR, Lipid panel, Hemoglobin A1c  Essential hypertension  Pure hypercholesterolemia  Left knee pain, unspecified chronicity - Plan: DG Knee Complete 4 Views Left  Need for influenza vaccination - Plan: Flu vaccine trivalent PF, 6mos and older(Flulaval,Afluria,Fluarix,Fluzone)   Plan:   Chronic, stable.  Continue metformin  XR 1000 mg twice daily, Actos  30 mg daily, Rybelsus  7 mg daily. Counseled on diet and exercise. Chronic, stable.  Continue olmesartan  20 mg daily. Chronic, stable.  Continue with 40 mg daily. Heat, ice, Tylenol , stretching exercises.  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  Check an x-ray to rule out arthritis though. Flu shot today. F/u in 6 mo. The patient voiced understanding and agreement to the plan.  Mabel [place], DO 01/13/24 10:55 AM

## 2024-02-23 ENCOUNTER — Encounter: Payer: Self-pay | Admitting: Family Medicine

## 2024-02-26 MED ORDER — OLMESARTAN MEDOXOMIL 20 MG PO TABS: 20.0000 mg | ORAL_TABLET | Freq: Every day | ORAL | 0 refills | Status: AC

## 2024-03-03 ENCOUNTER — Other Ambulatory Visit

## 2024-03-03 ENCOUNTER — Other Ambulatory Visit: Payer: Self-pay

## 2024-03-03 DIAGNOSIS — R972 Elevated prostate specific antigen [PSA]: Secondary | ICD-10-CM

## 2024-03-03 LAB — PSA: Prostate Specific Ag, Serum: 1 ng/mL (ref 0.0–4.0)

## 2024-03-04 ENCOUNTER — Ambulatory Visit: Payer: Self-pay | Admitting: Urology

## 2024-07-10 ENCOUNTER — Encounter: Admitting: Family Medicine

## 2024-09-14 ENCOUNTER — Ambulatory Visit: Admitting: Urology
